# Patient Record
Sex: Female | Born: 1990 | Race: White | Hispanic: No | Marital: Single | Smoking: Current every day smoker
Health system: Southern US, Community
[De-identification: ages and names within clinical notes are randomized; demographics above are authoritative.]

## PROBLEM LIST (undated history)

## (undated) ENCOUNTER — Inpatient Hospital Stay: Payer: Self-pay

## (undated) DIAGNOSIS — Z8759 Personal history of other complications of pregnancy, childbirth and the puerperium: Secondary | ICD-10-CM

## (undated) DIAGNOSIS — Z8632 Personal history of gestational diabetes: Secondary | ICD-10-CM

## (undated) DIAGNOSIS — F419 Anxiety disorder, unspecified: Secondary | ICD-10-CM

## (undated) DIAGNOSIS — G43909 Migraine, unspecified, not intractable, without status migrainosus: Secondary | ICD-10-CM

## (undated) HISTORY — DX: Personal history of other complications of pregnancy, childbirth and the puerperium: Z87.59

## (undated) HISTORY — PX: TONSILLECTOMY: SUR1361

## (undated) HISTORY — DX: Personal history of gestational diabetes: Z86.32

## (undated) HISTORY — PX: ABDOMINAL HYSTERECTOMY: SHX81

## (undated) HISTORY — PX: CHOLECYSTECTOMY: SHX55

---
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
## 2008-03-17 DIAGNOSIS — O24419 Gestational diabetes mellitus in pregnancy, unspecified control: Secondary | ICD-10-CM

## 2008-03-17 DIAGNOSIS — O1490 Unspecified pre-eclampsia, unspecified trimester: Secondary | ICD-10-CM

## 2008-05-08 ENCOUNTER — Emergency Department: Payer: Self-pay | Admitting: Emergency Medicine

## 2008-08-22 ENCOUNTER — Observation Stay: Payer: Self-pay

## 2008-09-21 ENCOUNTER — Observation Stay: Payer: Self-pay

## 2008-10-14 ENCOUNTER — Emergency Department: Payer: Self-pay | Admitting: Emergency Medicine

## 2008-11-11 ENCOUNTER — Observation Stay: Payer: Self-pay | Admitting: Obstetrics & Gynecology

## 2008-11-12 ENCOUNTER — Emergency Department: Payer: Self-pay | Admitting: Emergency Medicine

## 2008-11-19 ENCOUNTER — Emergency Department: Payer: Self-pay | Admitting: Emergency Medicine

## 2008-12-08 ENCOUNTER — Inpatient Hospital Stay: Payer: Self-pay | Admitting: Unknown Physician Specialty

## 2009-01-09 ENCOUNTER — Emergency Department: Payer: Self-pay | Admitting: Emergency Medicine

## 2009-01-11 ENCOUNTER — Ambulatory Visit: Payer: Self-pay | Admitting: Surgery

## 2009-01-15 ENCOUNTER — Ambulatory Visit: Payer: Self-pay | Admitting: Surgery

## 2009-03-19 ENCOUNTER — Emergency Department (HOSPITAL_COMMUNITY): Admission: EM | Admit: 2009-03-19 | Discharge: 2009-03-19 | Payer: Self-pay | Admitting: Emergency Medicine

## 2009-04-10 ENCOUNTER — Emergency Department (HOSPITAL_COMMUNITY): Admission: EM | Admit: 2009-04-10 | Discharge: 2009-04-10 | Payer: Self-pay | Admitting: Emergency Medicine

## 2010-08-25 IMAGING — US ABDOMEN ULTRASOUND
1 series · 17 of 25 positions shown · non-contrast
Comparison: none

REASON FOR EXAM: RUQ PAIN
COMMENTS:

[Series 1: abdomen ultrasound · 17 of 43 slices shown]
[im 1/43]
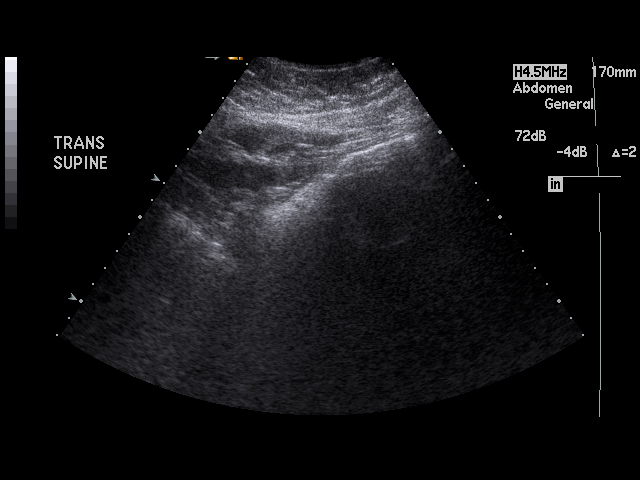
[im 4/43]
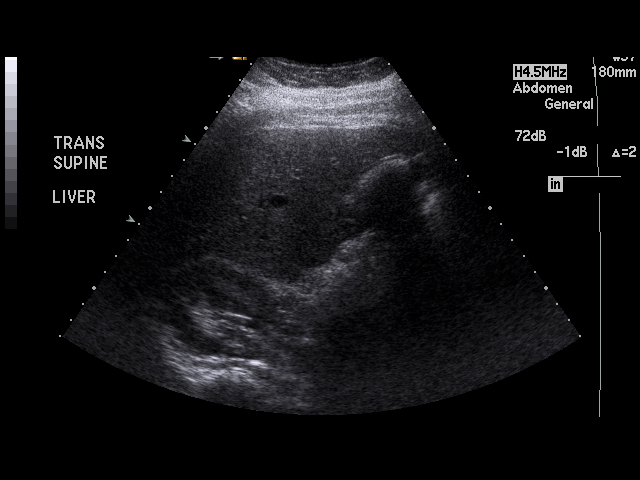
[im 6/43]
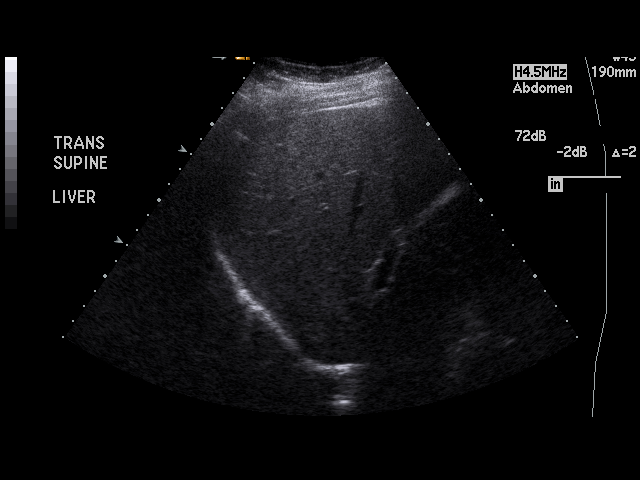
[im 9/43]
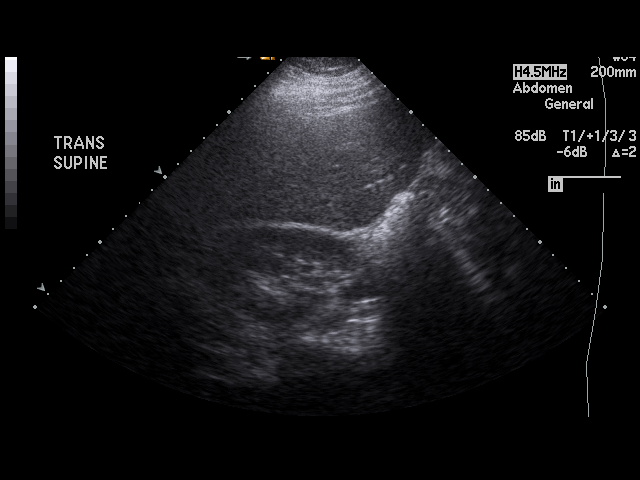
[im 11/43]
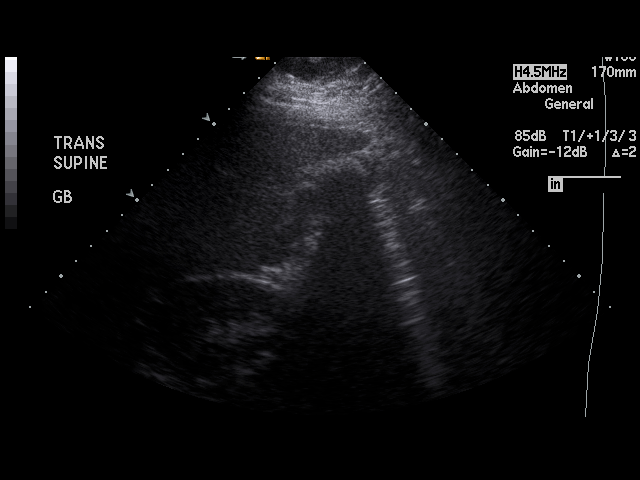
[im 15/43]
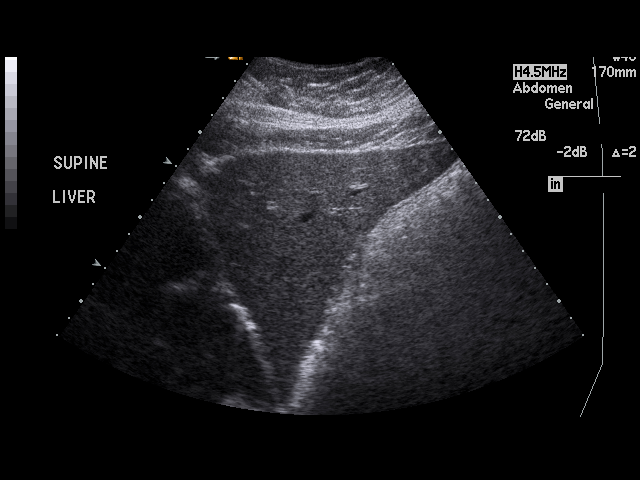
[im 16/43]
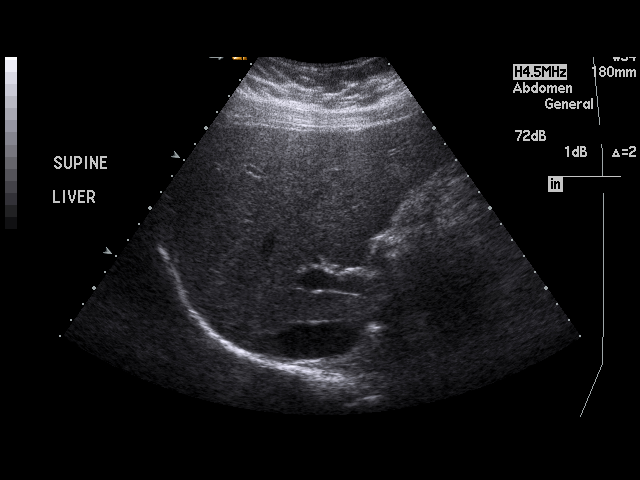
[im 20/43]
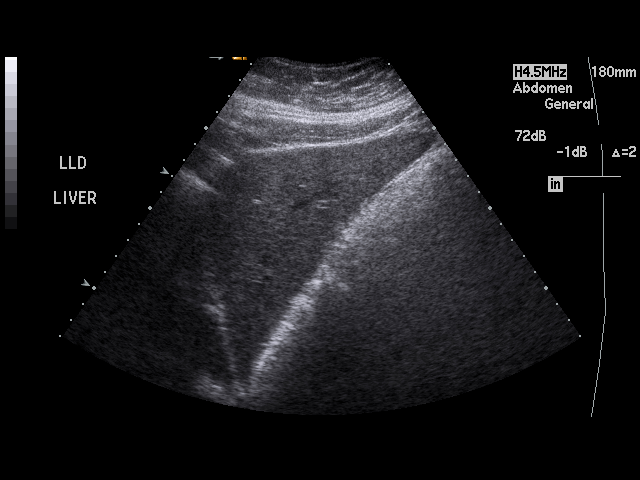
[im 22/43]
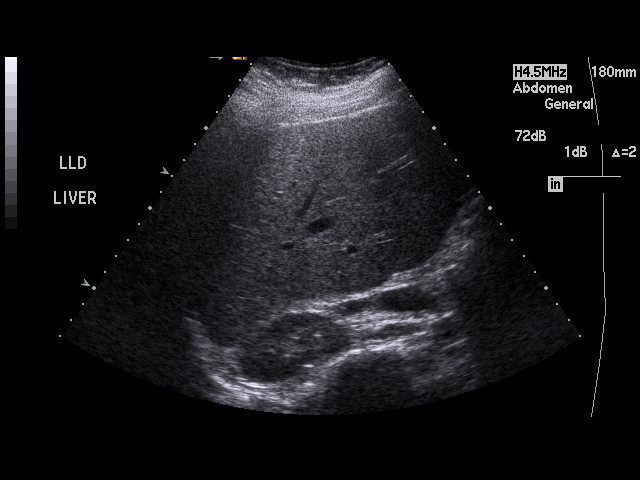
[im 23/43]
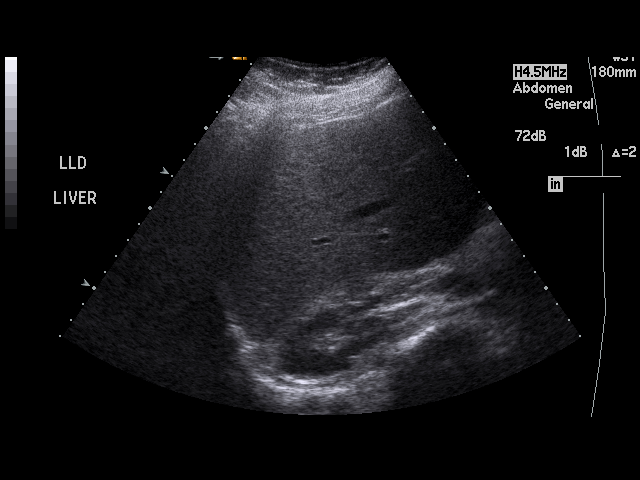
[im 27/43]
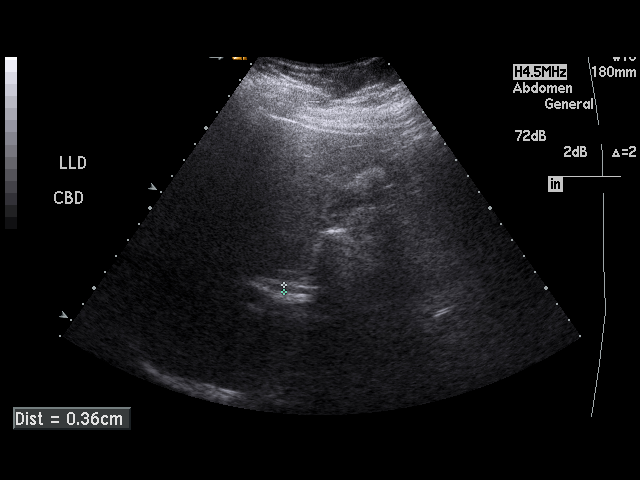
[im 29/43]
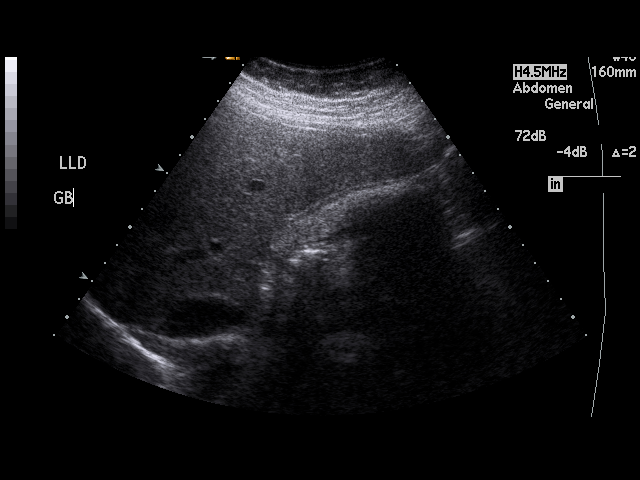
[im 32/43]
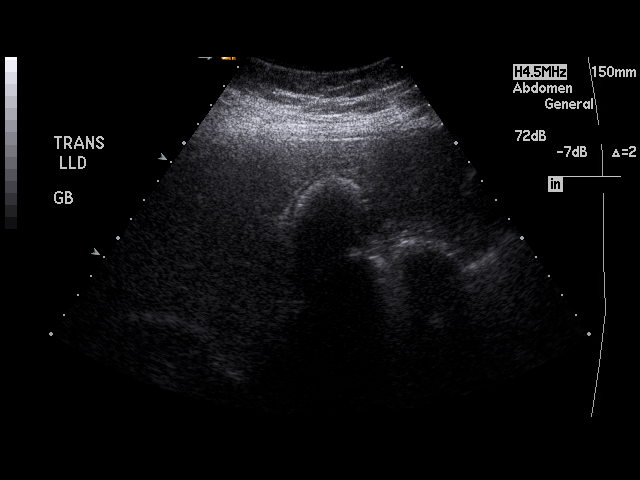
[im 34/43]
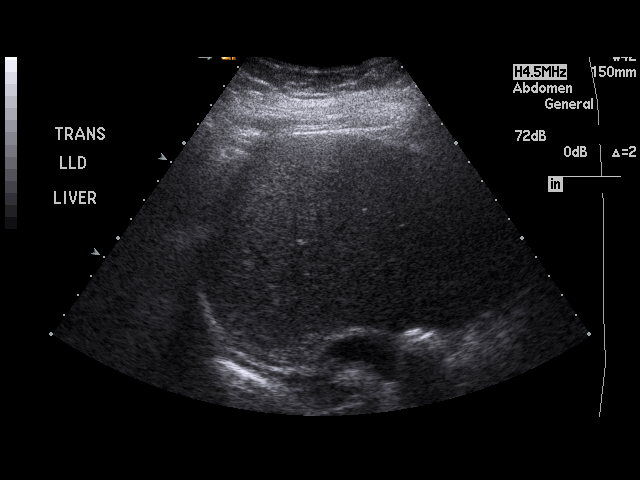
[im 37/43]
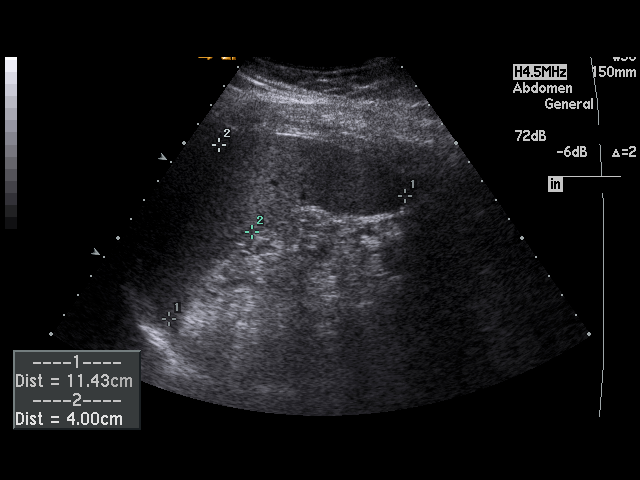
[im 39/43]
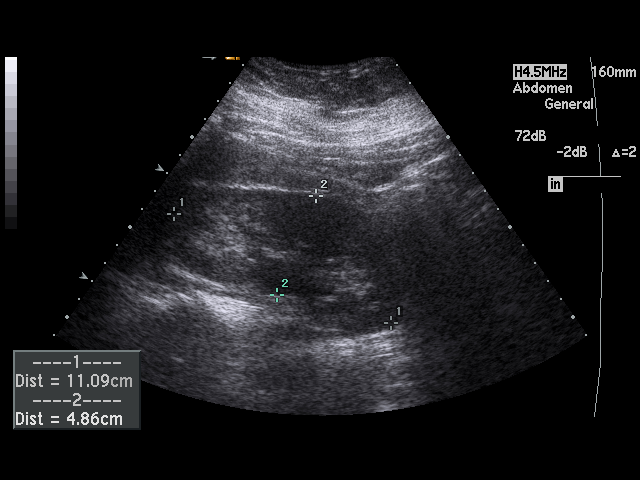
[im 43/43]
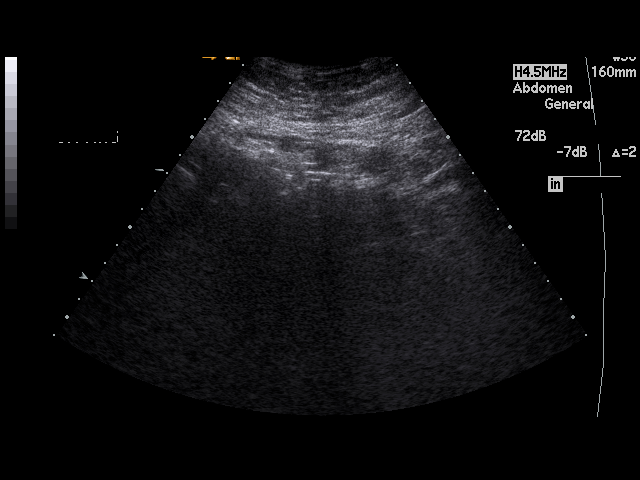

[17 of 25 positions shown; findings below may reference images not displayed]

PROCEDURE:     US  - US ABDOMEN GENERAL SURVEY  - January 10, 2009 [DATE]

RESULT:     Ultrasound of the abdomen demonstrates the gallbladder wall
measures 2.8 mm. The common bile duct is 3.6 mm. The gallbladder is somewhat
contracted and filled with sludge or multiple, tiny stones. There is no
pericholecystic fluid. The sonographic technologist reports that the patient
had a positive sonographic Murphy's sign. The hepatic echotexture is normal.
The pancreas is not seen. The spleen, kidneys and aorta appear to be
unremarkable.
IMPRESSION: Positive sonographic Murphy's sign and a partially
contracted gallbladder filled with sludge and small stones. The wall is near
the upper limits of normal. Surgical consultation is recommended.

## 2012-01-21 ENCOUNTER — Observation Stay: Payer: Self-pay

## 2012-03-03 ENCOUNTER — Observation Stay: Payer: Self-pay | Admitting: Obstetrics and Gynecology

## 2012-03-23 ENCOUNTER — Observation Stay: Payer: Self-pay | Admitting: Obstetrics and Gynecology

## 2012-03-24 ENCOUNTER — Inpatient Hospital Stay: Payer: Self-pay | Admitting: Obstetrics and Gynecology

## 2012-03-24 LAB — CBC WITH DIFFERENTIAL/PLATELET
HCT: 34.7 % — ABNORMAL LOW (ref 35.0–47.0)
HGB: 11.7 g/dL — ABNORMAL LOW (ref 12.0–16.0)
Lymphocyte #: 2.5 10*3/uL (ref 1.0–3.6)
MCH: 28.8 pg (ref 26.0–34.0)
MCHC: 33.8 g/dL (ref 32.0–36.0)
MCV: 85 fL (ref 80–100)
Monocyte #: 0.6 x10 3/mm (ref 0.2–0.9)
Neutrophil #: 7.3 10*3/uL — ABNORMAL HIGH (ref 1.4–6.5)
Neutrophil %: 67.9 %
RBC: 4.06 10*6/uL (ref 3.80–5.20)
WBC: 10.7 10*3/uL (ref 3.6–11.0)

## 2012-07-04 ENCOUNTER — Emergency Department: Payer: Self-pay | Admitting: Emergency Medicine

## 2012-07-04 LAB — CBC
MCH: 29 pg (ref 26.0–34.0)
MCHC: 34.5 g/dL (ref 32.0–36.0)
MCV: 84 fL (ref 80–100)
Platelet: 295 10*3/uL (ref 150–440)
RBC: 4.43 10*6/uL (ref 3.80–5.20)
RDW: 14.8 % — ABNORMAL HIGH (ref 11.5–14.5)
WBC: 10.8 10*3/uL (ref 3.6–11.0)

## 2012-07-04 LAB — HCG, QUANTITATIVE, PREGNANCY: Beta Hcg, Quant.: <1 m[IU]/mL — ABNORMAL LOW

## 2012-12-14 ENCOUNTER — Emergency Department: Payer: Self-pay | Admitting: Internal Medicine

## 2013-09-09 ENCOUNTER — Emergency Department: Payer: Self-pay | Admitting: Internal Medicine

## 2013-11-01 ENCOUNTER — Emergency Department: Payer: Self-pay | Admitting: Emergency Medicine

## 2014-03-31 ENCOUNTER — Emergency Department: Payer: Self-pay | Admitting: Student

## 2014-03-31 LAB — URINALYSIS, COMPLETE
BILIRUBIN, UR: NEGATIVE
Blood: NEGATIVE
Glucose,UR: NEGATIVE mg/dL (ref 0–75)
KETONE: NEGATIVE
Nitrite: NEGATIVE
PROTEIN: NEGATIVE
Ph: 5 (ref 4.5–8.0)
RBC,UR: 2 /HPF (ref 0–5)
Specific Gravity: 1.01 (ref 1.003–1.030)
Squamous Epithelial: 83
WBC UR: 6 /HPF (ref 0–5)

## 2014-03-31 LAB — COMPREHENSIVE METABOLIC PANEL
ANION GAP: 5 — AB (ref 7–16)
Albumin: 3.8 g/dL (ref 3.4–5.0)
Alkaline Phosphatase: 72 U/L
BILIRUBIN TOTAL: 0.4 mg/dL (ref 0.2–1.0)
BUN: 10 mg/dL (ref 7–18)
CHLORIDE: 105 mmol/L (ref 98–107)
CO2: 29 mmol/L (ref 21–32)
CREATININE: 0.96 mg/dL (ref 0.60–1.30)
Calcium, Total: 8.8 mg/dL (ref 8.5–10.1)
EGFR (Non-African Amer.): >60
Glucose: 88 mg/dL (ref 65–99)
Osmolality: 276 (ref 275–301)
POTASSIUM: 4 mmol/L (ref 3.5–5.1)
SGOT(AST): 18 U/L (ref 15–37)
SGPT (ALT): 29 U/L
Sodium: 139 mmol/L (ref 136–145)
TOTAL PROTEIN: 7.5 g/dL (ref 6.4–8.2)

## 2014-03-31 LAB — CBC
HCT: 42 % (ref 35.0–47.0)
HGB: 13.7 g/dL (ref 12.0–16.0)
MCH: 28 pg (ref 26.0–34.0)
MCHC: 32.6 g/dL (ref 32.0–36.0)
MCV: 86 fL (ref 80–100)
Platelet: 295 10*3/uL (ref 150–440)
RBC: 4.88 10*6/uL (ref 3.80–5.20)
RDW: 13.9 % (ref 11.5–14.5)
WBC: 9.2 10*3/uL (ref 3.6–11.0)

## 2014-03-31 LAB — HCG, QUANTITATIVE, PREGNANCY

## 2014-03-31 LAB — CK TOTAL AND CKMB (NOT AT ARMC)
CK, Total: 69 U/L (ref 26–192)
CK-MB: 1.5 ng/mL (ref 0.5–3.6)

## 2014-03-31 LAB — TROPONIN I

## 2014-04-01 LAB — URINE CULTURE

## 2014-06-08 ENCOUNTER — Emergency Department: Payer: Self-pay | Admitting: Emergency Medicine

## 2014-07-25 NOTE — H&P (Signed)
L&D Evaluation:  History Expanded:   HPI 24 yo G2P1001, EDD of 03/29/11 per 10week US, presents at 36 3/7 weeks with c/o nausea, several episodes of vomiting, migraine (pt reports chronic migraines this pregnancy tx with Compazine) and intense pelvic pressure. [facility], Increased DS risk on 1st trimester screen with negative Amniocentesis.    Blood Type (Maternal) A positive    Maternal HIV Negative    Maternal Syphilis Ab Nonreactive    Maternal Varicella Immune    Rubella Results (Maternal) immune    Patient's Medical History depression     Patient's Surgical History Colecystectomy  Arm surgery     Medications Pre Natal Vitamins  Compazine for nausea/headaches     Allergies NKDA    Social History none  former smoker    ROS:   ROS see HPI   Exam:   Vital Signs stable     General appears uncomfortable    Mental Status clear     Abdomen gravid, non-tender    Pelvic no external lesions, 1/50/-2    Mebranes Intact    FHT difficult obtaining continuous FHR tracing, BL 135 min-mod variability, + accels    Ucx absent   Impression:   Impression IUP at 36 weeks with nausea/vomiting   Plan:   Comments IV fluids Phenergan   Electronic Signatures: Brenlyn Beshara, Marta Lamasamara K (CNM)  (Signed 18-Dec-13 09:46)  Authored: L&D Evaluation   Last Updated: 18-Dec-13 09:46 by Vella KohlerBrothers, Bo Rogue K (CNM)

## 2014-07-25 NOTE — H&P (Signed)
L&D Evaluation:  History:   HPI 24 yo G2P1001, EDD of 03/29/11 per 10week US, presents at 39 3/7 weeks for elective IOL. She was seen  by Dr Elta Guadeloupelipp yesterday and arrangements were made for Cervidil ripening last night. Paient has had ongoing pelvic pain and worsening pain recently and resulting insomnia.Patient was not induced last night due to staffing. She reports back today for induction, very upset that induction was not begun. She denies regular contractions today. No bleeding or LOF. 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 She has had no outbreaks since then and has not been prescribed an antiviral. A POS, RI, VI, GBS negative.    Presents with Pelvic pain/ for IOL    Patient's Medical History depression, migraines    Patient's Surgical History Arm surgery, cholecystectomy    Medications Pre Natal Vitamins  Compazine for nausea/headaches. Has been out of vitamins x 2 weeks    Allergies NKDA    Social History none  former smoker   ROS:   ROS see HPI   Exam:   Vital Signs stable    Urine Protein not completed    General upset, tearful    Mental Status clear    Chest clear    Heart normal sinus rhythm, no murmur/gallop/rubs    Abdomen gravid, non-tender    Estimated Fetal Weight Average for gestational age    Fetal Position OP on ultrasound    Edema trace    Reflexes 1+    Pelvic no external lesions, Dr Bonney AidStaebler did speculum exam last night-no HSV lesions seen. CX:1cm/25-50%/-2    Mebranes Intact    FHT normal rate with no decels, 135 with accels to 150    Ucx infrequent    Skin dry   Impression:   Impression IUP at 39 3/7 weeks for elective IOL   Plan:   Plan EFM/NST, Discussed with patient unripe cx and increased risk for C-section, failed induction of labor, and prolonged induction process. Encouraged to not have  induction at this time. Patient adamant about proceeding with induction and  this provider feels that I now need to proceed with the plan of provider who set up induction of labor. Will give Cervidil today.   Electronic Signatures: Trinna BalloonGutierrez, Jamesyn Moorefield L (CNM)  (Signed 08-Jan-14 10:01)  Authored: L&D Evaluation   Last Updated: 08-Jan-14 10:01 by Trinna BalloonGutierrez, Shaunte Tuft L (CNM)

## 2014-08-05 ENCOUNTER — Emergency Department
Admission: EM | Admit: 2014-08-05 | Discharge: 2014-08-05 | Disposition: A | Discharge status: Home / Self Care | Payer: Self-pay | Attending: Emergency Medicine | Admitting: Emergency Medicine

## 2014-08-05 ENCOUNTER — Encounter: Payer: Self-pay | Admitting: Emergency Medicine

## 2014-08-05 DIAGNOSIS — Z3202 Encounter for pregnancy test, result negative: Secondary | ICD-10-CM | POA: Insufficient documentation

## 2014-08-05 DIAGNOSIS — Z720 Tobacco use: Secondary | ICD-10-CM | POA: Insufficient documentation

## 2014-08-05 DIAGNOSIS — Z7251 High risk heterosexual behavior: Secondary | ICD-10-CM

## 2014-08-05 DIAGNOSIS — N898 Other specified noninflammatory disorders of vagina: Secondary | ICD-10-CM

## 2014-08-05 DIAGNOSIS — N941 Dyspareunia: Secondary | ICD-10-CM | POA: Insufficient documentation

## 2014-08-05 DIAGNOSIS — N938 Other specified abnormal uterine and vaginal bleeding: Secondary | ICD-10-CM | POA: Insufficient documentation

## 2014-08-05 DIAGNOSIS — Z7721 Contact with and (suspected) exposure to potentially hazardous body fluids: Secondary | ICD-10-CM

## 2014-08-05 LAB — CHLAMYDIA/NGC RT PCR (ARMC ONLY)
CHLAMYDIA TR: NOT DETECTED
N gonorrhoeae: NOT DETECTED

## 2014-08-05 LAB — WET PREP, GENITAL
Clue Cells Wet Prep HPF POC: NONE SEEN
Trich, Wet Prep: NONE SEEN
Yeast Wet Prep HPF POC: NONE SEEN

## 2014-08-05 NOTE — ED Notes (Signed)
Urine preg negative

## 2014-08-05 NOTE — ED Provider Notes (Signed)
CSN: [id number]     Arrival date & time 08/05/14  1436 History   First MD Initiated Contact with Patient 08/05/14 1522     Chief Complaint  Patient presents with  . Foreign Body in Vagina    condom came off during intercourse last night and pt thinks is retained in vagina, unable to remove herself      HPI Comments: 24 year old female with concerns that she has a retained condom in her vagina. She reports that she had intercourse with a new partner around 5 AM this morning. After their sexual encounter they could not find the condom. Her last normal menstrual period was 2 weeks ago. She is not taking any birth control. She has had some pelvic pain and slight bleeding today  Patient is a 24 y.o. female presenting with foreign body in vagina. The history is provided by the patient.  Foreign Body in Vagina This is a new problem. The current episode started today. The problem has been unchanged. Associated symptoms include abdominal pain. Pertinent negatives include no fever or rash. She has tried nothing for the symptoms. The treatment provided mild relief.    History reviewed. No pertinent past medical history. History reviewed. No pertinent past surgical history. History reviewed. No pertinent family history. History  Substance Use Topics  . Smoking status: Current Every Day Smoker -- 0.25 packs/day  . Smokeless tobacco: Not on file  . Alcohol Use: No   OB History    No data available     Review of Systems  Constitutional: Negative for fever.  Gastrointestinal: Positive for abdominal pain.  Genitourinary: Positive for vaginal bleeding and vaginal discharge. Negative for dysuria, difficulty urinating, pelvic pain and dyspareunia.  Skin: Negative for rash.  All other systems reviewed and are negative.     Allergies  Review of patient's allergies indicates no known allergies.  Home Medications   Prior to Admission medications   Not on File   BP 145/73 mmHg  Pulse 89   Temp(Src) 98.2 F (36.8 C) (Oral)  Resp 20  Ht 5\' 8"  (1.727 m)  Wt 190 lb (86.183 kg)  BMI 28.90 kg/m2  SpO2 98%  LMP 07/09/2014 (Approximate) Physical Exam  Constitutional: She is oriented to person, place, and time. Vital signs are normal. She appears well-developed and well-nourished.  Non-toxic appearance. She does not have a sickly appearance.  Abdominal: There is no tenderness. There is no rebound and no guarding.  Genitourinary: Uterus normal. Pelvic exam was performed with patient supine. Cervix exhibits discharge. Cervix exhibits no motion tenderness. Right adnexum displays no mass and no tenderness. Left adnexum displays no mass and no tenderness. Vaginal discharge found.  No evidence of foreign body   Musculoskeletal: Normal range of motion.  Neurological: She is alert and oriented to person, place, and time.  Skin: Skin is warm and dry. No rash noted.  Psychiatric: She has a normal mood and affect. Her behavior is normal. Judgment and thought content normal.  Nursing note and vitals reviewed.   ED Course  Procedures (including critical care time) Labs Review Labs Reviewed  WET PREP, GENITAL - Abnormal; Notable for the following:    WBC, Wet Prep HPF POC FEW (*)    All other components within normal limits  CHLAMYDIA/NGC RT PCR [facility])     Imaging Review No results found.   EKG Interpretation None      MDM  Discussed with patient that there was no evidence of retained condom. She  requests STD and pregnancy testing. Advised for positive gonorrhea and Chlamydia tests she will be called. Wet prep is negative. Pregnancy test is negative Final diagnoses:  Exposure to potentially hazardous body fluids  Vaginal discharge  Unprotected sexual intercourse       Luvenia Redden, PA-C 08/05/14 1623  Governor Rooks, MD 08/07/14 [phone]

## 2014-08-05 NOTE — ED Notes (Signed)
States slight bleeding when tried to remove

## 2015-02-28 ENCOUNTER — Emergency Department
Admission: EM | Admit: 2015-02-28 | Discharge: 2015-02-28 | Disposition: A | Discharge status: Home / Self Care | Payer: Self-pay | Attending: Emergency Medicine | Admitting: Emergency Medicine

## 2015-02-28 ENCOUNTER — Emergency Department: Payer: Self-pay

## 2015-02-28 DIAGNOSIS — J40 Bronchitis, not specified as acute or chronic: Secondary | ICD-10-CM | POA: Insufficient documentation

## 2015-02-28 DIAGNOSIS — F17200 Nicotine dependence, unspecified, uncomplicated: Secondary | ICD-10-CM | POA: Insufficient documentation

## 2015-02-28 DIAGNOSIS — Z79899 Other long term (current) drug therapy: Secondary | ICD-10-CM | POA: Insufficient documentation

## 2015-02-28 MED ORDER — IPRATROPIUM-ALBUTEROL 0.5-2.5 (3) MG/3ML IN SOLN
3.0000 mL | Freq: Once | RESPIRATORY_TRACT | Status: AC
  Administered 2015-02-28: 3 mL via RESPIRATORY_TRACT
  Filled 2015-02-28: qty 3

## 2015-02-28 MED ORDER — PREDNISONE 20 MG PO TABS
60.0000 mg | ORAL_TABLET | Freq: Once | ORAL | Status: AC
  Administered 2015-02-28: 60 mg via ORAL
  Filled 2015-02-28: qty 3

## 2015-02-28 MED ORDER — IBUPROFEN 800 MG PO TABS
800.0000 mg | ORAL_TABLET | Freq: Once | ORAL | Status: AC
  Administered 2015-02-28: 800 mg via ORAL
  Filled 2015-02-28: qty 1

## 2015-02-28 MED ORDER — IPRATROPIUM-ALBUTEROL 0.5-2.5 (3) MG/3ML IN SOLN
RESPIRATORY_TRACT | Status: AC
  Filled 2015-02-28: qty 3

## 2015-02-28 MED ORDER — PREDNISONE 20 MG PO TABS: 60.0000 mg | ORAL_TABLET | Freq: Every day | ORAL | Status: DC

## 2015-02-28 MED ORDER — HYDROCOD POLST-CPM POLST ER 10-8 MG/5ML PO SUER: 5.0000 mL | Freq: Two times a day (BID) | ORAL | Status: DC

## 2015-02-28 MED ORDER — BENZONATATE 100 MG PO CAPS
100.0000 mg | ORAL_CAPSULE | Freq: Once | ORAL | Status: AC
  Administered 2015-02-28: 100 mg via ORAL
  Filled 2015-02-28: qty 1

## 2015-02-28 NOTE — ED Provider Notes (Signed)
[facility] Emergency Department Provider Note  ____________________________________________  Time seen: Approximately 3:14 AM  I have reviewed the triage vital signs and the nursing notes.   HISTORY  Chief Complaint Cough    HPI Marie Palmer is a 24 y.o. female who comes into the hospital with a cough since yesterday. The patient reports that she's been coughing so much it is starting to hurt into her chest and her back. The patient reports that she took some TheraFlu but it hasn't helped. The patient reports that her daughter has a cold and was taken to her doctor and told that it was a virus. The patient also reports some shortness of breath. She's had hot and cold sweats on and off but no distinct fever. She reports that she coughs so much that she has spit up some green stuff. The patient does not have a history of asthma but is a former smoker. The patient quit smoking about a month ago. The patient came in because she was unsure what was going on and wanted to get evaluated.Patient rates her pain a 7 out of 10 in intensity.   No past medical history  There are no active problems to display for this patient.   Past surgical history  CCY  Current Outpatient Rx  Name  Route  Sig  Dispense  Refill  . Phenylephrine-Pheniramine-DM (THERAFLU COLD & COUGH PO)   Oral   Take by mouth.         . chlorpheniramine-HYDROcodone (TUSSIONEX PENNKINETIC ER) 10-8 MG/5ML SUER   Oral   Take 5 mLs by mouth 2 (two) times daily.   140 mL   0   . predniSONE (DELTASONE) 20 MG tablet   Oral   Take 3 tablets (60 mg total) by mouth daily.   12 tablet   0     Allergies Review of patient's allergies indicates no known allergies.  No family history on file.  Social History Social History  Substance Use Topics  . Smoking status: Current Every Day Smoker -- 0.25 packs/day  . Smokeless tobacco: Not on file  . Alcohol Use: No    Review of  Systems Constitutional: No fever/chills Eyes: No visual changes. ENT: No sore throat. Cardiovascular:  chest pain. Respiratory: Cough and shortness of breath Gastrointestinal: No abdominal pain.  No nausea, no vomiting.  No diarrhea.  No constipation. Genitourinary: Negative for dysuria. Musculoskeletal: back pain. Skin: Negative for rash. Neurological: Negative for headaches, focal weakness or numbness.  10-point ROS otherwise negative.  ____________________________________________   PHYSICAL EXAM:  VITAL SIGNS: ED Triage Vitals  Enc Vitals Group     BP 02/28/15 0259 135/77 mmHg     Pulse Rate 02/28/15 0259 98     Resp 02/28/15 0259 18     Temp 02/28/15 0259 98.5 F (36.9 C)     Temp src --      SpO2 02/28/15 0259 93 %     Weight 02/28/15 0259 185 lb (83.915 kg)     Height 02/28/15 0259  (1.727 m)     Head Cir --      Peak Flow --      Pain Score 02/28/15 0259 7     Pain Loc --      Pain Edu? --      Excl. in GC? --     Constitutional: Alert and oriented. ill appearing and in mild distress. Eyes: Conjunctivae are normal. PERRL. EOMI. Head: Atraumatic. Nose: No congestion/rhinnorhea.  Mouth/Throat: Mucous membranes are moist.  Oropharynx non-erythematous. Cardiovascular: Normal rate, regular rhythm. Grossly normal heart sounds.  Good peripheral circulation. Respiratory: Normal respiratory effort.  No retractions. Lungs CTAB. Some mild prolonged expiratory phase with significant coughing, chest tender to palpation Gastrointestinal: Soft and nontender. No distention. Active bowel sounds Musculoskeletal: No lower extremity tenderness nor edema.   Neurologic:  Normal speech and language.  Skin:  Skin is warm, dry and intact.  Psychiatric: Mood and affect are normal.   ____________________________________________   LABS (all labs ordered are listed, but only abnormal results are displayed)  Labs Reviewed - No data to  display ____________________________________________  EKG  None ____________________________________________  RADIOLOGY  Chest x-ray: No active cardiopulmonary disease ____________________________________________   PROCEDURES  Procedure(s) performed: None  Critical Care performed: No  ____________________________________________   INITIAL IMPRESSION / ASSESSMENT AND PLAN / ED COURSE  Pertinent labs & imaging results that were available during my care of the patient were reviewed by me and considered in my medical decision making (see chart for details).  This is a 24 year old female who comes in today with a cough and shortness of breath as well as some chest pain. The patient does not have a pneumonia on chest x-ray but this had some mild prolonged expiratory phase. I did give the patient a DuoNeb, benzonatate and ibuprofen. I will reassess the patient's symptoms and determine if her coughing has improved.  The patient reports that she does still have some cough but it is improved compared to when I was in the room previously. I informed the patient that it appears as though she has bronchitis that may be viral in nature similar to her daughters. I will give the patient some prednisone as well as some Tussionex for her cough. I will encourage the patient to follow-up with the acute care clinic. The patient will be discharged home to follow-up. ____________________________________________   FINAL CLINICAL IMPRESSION(S) / ED DIAGNOSES  Final diagnoses:  Bronchitis      Rebecka ApleyAllison P Mitesh Rosendahl, MD 02/28/15 0830

## 2015-02-28 NOTE — Discharge Instructions (Signed)
Upper Respiratory Infection, Adult Most upper respiratory infections (URIs) are a viral infection of the air passages leading to the lungs. A URI affects the nose, throat, and upper air passages. The most common type of URI is nasopharyngitis and is typically referred to as "the common cold." URIs run their course and usually go away on their own. Most of the time, a URI does not require medical attention, but sometimes a bacterial infection in the upper airways can follow a viral infection. This is called a secondary infection. Sinus and middle ear infections are common types of secondary upper respiratory infections. Bacterial pneumonia can also complicate a URI. A URI can worsen asthma and chronic obstructive pulmonary disease (COPD). Sometimes, these complications can require emergency medical care and may be life threatening.  CAUSES Almost all URIs are caused by viruses. A virus is a type of germ and can spread from one person to another.  RISKS FACTORS You may be at risk for a URI if:   You smoke.   You have chronic heart or lung disease.  You have a weakened defense (immune) system.   You are very young or very old.   You have nasal allergies or asthma.  You work in crowded or poorly ventilated areas.  You work in health care facilities or schools. SIGNS AND SYMPTOMS  Symptoms typically develop 2-3 days after you come in contact with a cold virus. Most viral URIs last 7-10 days. However, viral URIs from the influenza virus (flu virus) can last 14-18 days and are typically more severe. Symptoms may include:   Runny or stuffy (congested) nose.   Sneezing.   Cough.   Sore throat.   Headache.   Fatigue.   Fever.   Loss of appetite.   Pain in your forehead, behind your eyes, and over your cheekbones (sinus pain).  Muscle aches.  DIAGNOSIS  Your health care provider may diagnose a URI by:  Physical exam.  Tests to check that your symptoms are not due to  another condition such as:  Strep throat.  Sinusitis.  Pneumonia.  Asthma. TREATMENT  A URI goes away on its own with time. It cannot be cured with medicines, but medicines may be prescribed or recommended to relieve symptoms. Medicines may help:  Reduce your fever.  Reduce your cough.  Relieve nasal congestion. HOME CARE INSTRUCTIONS   Take medicines only as directed by your health care provider.   Gargle warm saltwater or take cough drops to comfort your throat as directed by your health care provider.  Use a warm mist humidifier or inhale steam from a shower to increase air moisture. This may make it easier to breathe.  Drink enough fluid to keep your urine clear or pale yellow.   Eat soups and other clear broths and maintain good nutrition.   Rest as needed.   Return to work when your temperature has returned to normal or as your health care provider advises. You may need to stay home longer to avoid infecting others. You can also use a face mask and careful hand washing to prevent spread of the virus.  Increase the usage of your inhaler if you have asthma.   Do not use any tobacco products, including cigarettes, chewing tobacco, or electronic cigarettes. If you need help quitting, ask your health care provider. PREVENTION  The best way to protect yourself from getting a cold is to practice good hygiene.   Avoid oral or hand contact with people with cold   symptoms.   Wash your hands often if contact occurs.  There is no clear evidence that vitamin C, vitamin E, echinacea, or exercise reduces the chance of developing a cold. However, it is always recommended to get plenty of rest, exercise, and practice good nutrition.  SEEK MEDICAL CARE IF:   You are getting worse rather than better.   Your symptoms are not controlled by medicine.   You have chills.  You have worsening shortness of breath.  You have brown or red mucus.  You have yellow or brown nasal  discharge.  You have pain in your face, especially when you bend forward.  You have a fever.  You have swollen neck glands.  You have pain while swallowing.  You have white areas in the back of your throat. SEEK IMMEDIATE MEDICAL CARE IF:   You have severe or persistent:  Headache.  Ear pain.  Sinus pain.  Chest pain.  You have chronic lung disease and any of the following:  Wheezing.  Prolonged cough.  Coughing up blood.  A change in your usual mucus.  You have a stiff neck.  You have changes in your:  Vision.  Hearing.  Thinking.  Mood. MAKE SURE YOU:   Understand these instructions.  Will watch your condition.  Will get help right away if you are not doing well or get worse.   This information is not intended to replace advice given to you by your health care provider. Make sure you discuss any questions you have with your health care provider.   Document Released: 08/27/2000 Document Revised: 07/18/2014 Document Reviewed: 06/08/2013 Elsevier Interactive Patient Education 2016 [employer]  

## 2015-02-28 NOTE — ED Notes (Signed)
Pt with cough since yest, denies any fever.

## 2015-05-15 ENCOUNTER — Emergency Department
Admission: EM | Admit: 2015-05-15 | Discharge: 2015-05-16 | Disposition: A | Discharge status: Home / Self Care | Payer: Self-pay | Attending: Emergency Medicine | Admitting: Emergency Medicine

## 2015-05-15 ENCOUNTER — Encounter: Payer: Self-pay | Admitting: Emergency Medicine

## 2015-05-15 DIAGNOSIS — H53149 Visual discomfort, unspecified: Secondary | ICD-10-CM | POA: Insufficient documentation

## 2015-05-15 DIAGNOSIS — R51 Headache: Secondary | ICD-10-CM

## 2015-05-15 DIAGNOSIS — B349 Viral infection, unspecified: Secondary | ICD-10-CM | POA: Insufficient documentation

## 2015-05-15 DIAGNOSIS — R519 Headache, unspecified: Secondary | ICD-10-CM

## 2015-05-15 DIAGNOSIS — Z87891 Personal history of nicotine dependence: Secondary | ICD-10-CM | POA: Insufficient documentation

## 2015-05-15 HISTORY — DX: Migraine, unspecified, not intractable, without status migrainosus: G43.909

## 2015-05-15 LAB — RAPID INFLUENZA A&B ANTIGENS (ARMC ONLY)
INFLUENZA A (ARMC): NOT DETECTED — AB
INFLUENZA B (ARMC): NOT DETECTED — AB

## 2015-05-15 LAB — POCT RAPID STREP A: Streptococcus, Group A Screen (Direct): NEGATIVE

## 2015-05-15 MED ORDER — METOCLOPRAMIDE HCL 5 MG/ML IJ SOLN
10.0000 mg | Freq: Once | INTRAMUSCULAR | Status: AC
  Administered 2015-05-15: 10 mg via INTRAVENOUS
  Filled 2015-05-15: qty 2

## 2015-05-15 MED ORDER — ONDANSETRON 4 MG PO TBDP
ORAL_TABLET | ORAL | Status: AC
  Administered 2015-05-15: 4 mg via ORAL
  Filled 2015-05-15: qty 1

## 2015-05-15 MED ORDER — DIPHENHYDRAMINE HCL 50 MG/ML IJ SOLN
25.0000 mg | Freq: Once | INTRAMUSCULAR | Status: AC
  Administered 2015-05-15: 25 mg via INTRAVENOUS
  Filled 2015-05-15: qty 1

## 2015-05-15 MED ORDER — SODIUM CHLORIDE 0.9 % IV SOLN
Freq: Once | INTRAVENOUS | Status: AC
  Administered 2015-05-15: 22:00:00 via INTRAVENOUS

## 2015-05-15 MED ORDER — KETOROLAC TROMETHAMINE 30 MG/ML IJ SOLN
30.0000 mg | Freq: Once | INTRAMUSCULAR | Status: AC
  Administered 2015-05-15: 30 mg via INTRAVENOUS
  Filled 2015-05-15: qty 1

## 2015-05-15 MED ORDER — ONDANSETRON 4 MG PO TBDP
4.0000 mg | ORAL_TABLET | Freq: Once | ORAL | Status: AC | PRN
  Administered 2015-05-15: 4 mg via ORAL

## 2015-05-15 MED ORDER — ONDANSETRON 4 MG PO TBDP: 4.0000 mg | ORAL_TABLET | Freq: Three times a day (TID) | ORAL | Status: DC | PRN

## 2015-05-15 MED ORDER — OXYCODONE-ACETAMINOPHEN 5-325 MG PO TABS
1.0000 | ORAL_TABLET | Freq: Once | ORAL | Status: AC
Start: 2015-05-15 — End: 2015-05-15
  Administered 2015-05-15: 1 via ORAL
  Filled 2015-05-15: qty 1

## 2015-05-15 MED ORDER — BUTALBITAL-APAP-CAFFEINE 50-325-40 MG PO TABS: 1.0000 | ORAL_TABLET | Freq: Four times a day (QID) | ORAL | Status: DC | PRN

## 2015-05-15 NOTE — ED Provider Notes (Signed)
[facility] Emergency Department Provider Note     Time seen: ----------------------------------------- 9:24 PM on 05/15/2015 -----------------------------------------    I have reviewed the triage vital signs and the nursing notes.   HISTORY  Chief Complaint Migraine; Cough; and Sore Throat    HPI Marie Palmer is a 25 y.o. female who presents to ER for migraines for the last 2 days patient states she has diffuse headache, reports nausea and light sensitivity. She has had some nausea and vomiting with a migraine. Also reports sore throat cough and hot and cold feeling. Patient is not had body aches or chills. She taken over-the-counter medications without any improvement. She does have a history of migraines.   Past Medical History  Diagnosis Date  . Migraines     There are no active problems to display for this patient.   Past Surgical History  Procedure Laterality Date  . Cholecystectomy      Allergies Review of patient's allergies indicates no known allergies.  Social History Social History  Substance Use Topics  . Smoking status: Former Smoker -- 0.25 packs/day  . Smokeless tobacco: None  . Alcohol Use: No    Review of Systems Constitutional: Positive for fever Eyes: Negative for visual changes. ENT: Positive for sore throat Cardiovascular: Negative for chest pain. Respiratory: Negative for shortness of breath. Positive for cough Gastrointestinal: Negative for abdominal pain, positive for vomiting Genitourinary: Negative for dysuria. Musculoskeletal: Negative for back pain. Skin: Negative for rash. Neurological: Positive for diffuse headache  10-point ROS otherwise negative.  ____________________________________________   PHYSICAL EXAM:  VITAL SIGNS: ED Triage Vitals  Enc Vitals Group     BP 05/15/15 1920 146/71 mmHg     Pulse Rate 05/15/15 1920 88     Resp 05/15/15 1920 20     Temp 05/15/15 1920 98.8 F (37.1 C)      Temp Source 05/15/15 1920 Oral     SpO2 05/15/15 1920 95 %     Weight 05/15/15 1920 190 lb (86.183 kg)     Height 05/15/15 1920  (1.727 m)     Head Cir --      Peak Flow --      Pain Score 05/15/15 1920 9     Pain Loc --      Pain Edu? --      Excl. in GC? --     Constitutional: Alert and oriented. Well appearing and in no distress. Eyes: Conjunctivae are normal. PERRL. Normal extraocular movements. Minimal photophobia ENT   Head: Normocephalic and atraumatic.   Nose: No congestion/rhinnorhea.   Mouth/Throat: Mucous membranes are moist.   Neck: No stridor. Cardiovascular: Normal rate, regular rhythm. Normal and symmetric distal pulses are present in all extremities. No murmurs, rubs, or gallops. Respiratory: Normal respiratory effort without tachypnea nor retractions. Breath sounds are clear and equal bilaterally. No wheezes/rales/rhonchi. Gastrointestinal: Soft and nontender. No distention. No abdominal bruits.  Musculoskeletal: Nontender with normal range of motion in all extremities. No joint effusions.  No lower extremity tenderness nor edema. Neurologic:  Normal speech and language. No gross focal neurologic deficits are appreciated. Speech is normal. No gait instability. Skin:  Skin is warm, dry and intact. No rash noted. Psychiatric: Mood and affect are normal. Speech and behavior are normal. Patient exhibits appropriate insight and judgment. ___________________________________________  ED COURSE:  Pertinent labs & imaging results that were available during my care of the patient were reviewed by me and considered in my medical decision making (  see chart for details). Patient is in no acute distress, we'll assess for influenza. Also give her IV fluids and headache cocktail. ____________________________________________    LABS (pertinent positives/negatives)  Labs Reviewed  RAPID INFLUENZA A&B ANTIGENS ([facility] ONLY)  POCT RAPID STREP A    ____________________________________________  FINAL ASSESSMENT AND PLAN  Headache, viral illness  Plan: Patient with labs as dictated above. Patient is feeling better after IV fluid and headache cocktail. Her labs as dictated above. She stable for outpatient follow-up with her doctor.   Emily Filbert, MD   Emily Filbert, MD 05/15/15 2126

## 2015-05-15 NOTE — ED Notes (Signed)
POCT rapid strep negative

## 2015-05-15 NOTE — Discharge Instructions (Signed)
General Headache Without Cause A headache is pain or discomfort felt around the head or neck area. The specific cause of a headache may not be found. There are many causes and types of headaches. A few common ones are:  Tension headaches.  Migraine headaches.  Cluster headaches.  Chronic daily headaches. HOME CARE INSTRUCTIONS  Watch your condition for any changes. Take these steps to help with your condition: Managing Pain  Take over-the-counter and prescription medicines only as told by your health care provider.  Lie down in a dark, quiet room when you have a headache.  If directed, apply ice to the head and neck area:  Put ice in a plastic bag.  Place a towel between your skin and the bag.  Leave the ice on for 20 minutes, 2-3 times per day.  Use a heating pad or hot shower to apply heat to the head and neck area as told by your health care provider.  Keep lights dim if bright lights bother you or make your headaches worse. Eating and Drinking  Eat meals on a regular schedule.  Limit alcohol use.  Decrease the amount of caffeine you drink, or stop drinking caffeine. General Instructions  Keep all follow-up visits as told by your health care provider. This is important.  Keep a headache journal to help find out what may trigger your headaches. For example, write down:  What you eat and drink.  How much sleep you get.  Any change to your diet or medicines.  Try massage or other relaxation techniques.  Limit stress.  Sit up straight, and do not tense your muscles.  Do not use tobacco products, including cigarettes, chewing tobacco, or e-cigarettes. If you need help quitting, ask your health care provider.  Exercise regularly as told by your health care provider.  Sleep on a regular schedule. Get 7-9 hours of sleep, or the amount recommended by your health care provider. SEEK MEDICAL CARE IF:   Your symptoms are not helped by medicine.  You have a  headache that is different from the usual headache.  You have nausea or you vomit.  You have a fever. SEEK IMMEDIATE MEDICAL CARE IF:   Your headache becomes severe.  You have repeated vomiting.  You have a stiff neck.  You have a loss of vision.  You have problems with speech.  You have pain in the eye or ear.  You have muscular weakness or loss of muscle control.  You lose your balance or have trouble walking.  You feel faint or pass out.  You have confusion.   This information is not intended to replace advice given to you by your health care provider. Make sure you discuss any questions you have with your health care provider.   Document Released: 03/03/2005 Document Revised: 11/22/2014 Document Reviewed: 06/26/2014 Elsevier Interactive Patient Education 2016 [employer]  Viral Infections A viral infection can be caused by different types of viruses.Most viral infections are not serious and resolve on their own. However, some infections may cause severe symptoms and may lead to further complications. SYMPTOMS Viruses can frequently cause:  Minor sore throat.  Aches and pains.  Headaches.  Runny nose.  Different types of rashes.  Watery eyes.  Tiredness.  Cough.  Loss of appetite.  Gastrointestinal infections, resulting in nausea, vomiting, and diarrhea. These symptoms do not respond to antibiotics because the infection is not caused by bacteria. However, you might catch a bacterial infection following the viral infection. This is  sometimes called a "superinfection." Symptoms of such a bacterial infection may include:  Worsening sore throat with pus and difficulty swallowing.  Swollen neck glands.  Chills and a high or persistent fever.  Severe headache.  Tenderness over the sinuses.  Persistent overall ill feeling (malaise), muscle aches, and tiredness (fatigue).  Persistent cough.  Yellow, green, or brown mucus production with  coughing. HOME CARE INSTRUCTIONS   Only take over-the-counter or prescription medicines for pain, discomfort, diarrhea, or fever as directed by your caregiver.  Drink enough water and fluids to keep your urine clear or pale yellow. Sports drinks can provide valuable electrolytes, sugars, and hydration.  Get plenty of rest and maintain proper nutrition. Soups and broths with crackers or rice are fine. SEEK IMMEDIATE MEDICAL CARE IF:   You have severe headaches, shortness of breath, chest pain, neck pain, or an unusual rash.  You have uncontrolled vomiting, diarrhea, or you are unable to keep down fluids.  You or your child has an oral temperature above 102 F (38.9 C), not controlled by medicine.  Your baby is older than 3 months with a rectal temperature of 102 F (38.9 C) or higher.  Your baby is 66 months old or younger with a rectal temperature of 100.4 F (38 C) or higher. MAKE SURE YOU:   Understand these instructions.  Will watch your condition.  Will get help right away if you are not doing well or get worse.   This information is not intended to replace advice given to you by your health care provider. Make sure you discuss any questions you have with your health care provider.   Document Released: 12/11/2004 Document Revised: 05/26/2011 Document Reviewed: 08/09/2014 Elsevier Interactive Patient Education [employer]

## 2015-05-15 NOTE — ED Notes (Addendum)
Patient reports migraine times two days. Patient reports nausea and light sensitivity with migraine. Patient reports that she has had cough, sore throat and low grade fever. Patient states that she has taken otc medication with no improvement.

## 2015-11-20 ENCOUNTER — Encounter: Payer: Self-pay | Admitting: Emergency Medicine

## 2015-11-20 DIAGNOSIS — N9489 Other specified conditions associated with female genital organs and menstrual cycle: Secondary | ICD-10-CM | POA: Diagnosis not present

## 2015-11-20 DIAGNOSIS — Z7952 Long term (current) use of systemic steroids: Secondary | ICD-10-CM | POA: Diagnosis not present

## 2015-11-20 DIAGNOSIS — O99331 Smoking (tobacco) complicating pregnancy, first trimester: Secondary | ICD-10-CM | POA: Diagnosis not present

## 2015-11-20 DIAGNOSIS — Z3A01 Less than 8 weeks gestation of pregnancy: Secondary | ICD-10-CM | POA: Diagnosis not present

## 2015-11-20 DIAGNOSIS — Z79899 Other long term (current) drug therapy: Secondary | ICD-10-CM | POA: Insufficient documentation

## 2015-11-20 DIAGNOSIS — R1032 Left lower quadrant pain: Secondary | ICD-10-CM | POA: Diagnosis not present

## 2015-11-20 DIAGNOSIS — Z791 Long term (current) use of non-steroidal anti-inflammatories (NSAID): Secondary | ICD-10-CM | POA: Diagnosis not present

## 2015-11-20 DIAGNOSIS — R55 Syncope and collapse: Secondary | ICD-10-CM | POA: Insufficient documentation

## 2015-11-20 DIAGNOSIS — F17200 Nicotine dependence, unspecified, uncomplicated: Secondary | ICD-10-CM | POA: Diagnosis not present

## 2015-11-20 DIAGNOSIS — J069 Acute upper respiratory infection, unspecified: Secondary | ICD-10-CM | POA: Diagnosis not present

## 2015-11-20 DIAGNOSIS — O99511 Diseases of the respiratory system complicating pregnancy, first trimester: Secondary | ICD-10-CM | POA: Insufficient documentation

## 2015-11-20 LAB — CBC
HEMATOCRIT: 37.8 % (ref 35.0–47.0)
Hemoglobin: 13.1 g/dL (ref 12.0–16.0)
MCH: 28.7 pg (ref 26.0–34.0)
MCHC: 34.6 g/dL (ref 32.0–36.0)
MCV: 83 fL (ref 80.0–100.0)
Platelets: 270 10*3/uL (ref 150–440)
RBC: 4.56 MIL/uL (ref 3.80–5.20)
RDW: 13.8 % (ref 11.5–14.5)
WBC: 11.5 10*3/uL — ABNORMAL HIGH (ref 3.6–11.0)

## 2015-11-20 LAB — URINALYSIS COMPLETE WITH MICROSCOPIC (ARMC ONLY)
Bilirubin Urine: NEGATIVE
Glucose, UA: NEGATIVE mg/dL
Hgb urine dipstick: NEGATIVE
KETONES UR: NEGATIVE mg/dL
NITRITE: NEGATIVE
PH: 5 (ref 5.0–8.0)
PROTEIN: NEGATIVE mg/dL
Specific Gravity, Urine: 1.018 (ref 1.005–1.030)

## 2015-11-20 LAB — BASIC METABOLIC PANEL
ANION GAP: 7 (ref 5–15)
BUN: 7 mg/dL (ref 6–20)
CHLORIDE: 109 mmol/L (ref 101–111)
CO2: 22 mmol/L (ref 22–32)
Calcium: 8.9 mg/dL (ref 8.9–10.3)
Creatinine, Ser: 0.68 mg/dL (ref 0.44–1.00)
Glucose, Bld: 110 mg/dL — ABNORMAL HIGH (ref 65–99)
POTASSIUM: 3.5 mmol/L (ref 3.5–5.1)
Sodium: 138 mmol/L (ref 135–145)

## 2015-11-20 LAB — POCT PREGNANCY, URINE: PREG TEST UR: POSITIVE — AB

## 2015-11-20 NOTE — ED Triage Notes (Signed)
Pt presents to ED with c/o syncopal episode just prior to arrival; denies urinary incontinence. Pt states she had a sudden onset of dizziness and fell back onto her bed from a standing postion. Loc for approx 20-30 seconds. Denies any injury. Pt reports having congestion and nausea for the past several days. Pt alert and calm with no increased work of breathing or acute distress noted at this time.

## 2015-11-21 ENCOUNTER — Emergency Department: Payer: Medicaid Other

## 2015-11-21 ENCOUNTER — Encounter: Payer: Self-pay | Admitting: Radiology

## 2015-11-21 ENCOUNTER — Emergency Department
Admission: EM | Admit: 2015-11-21 | Discharge: 2015-11-21 | Discharge status: Against Medical Advice | Payer: Medicaid Other | Attending: Emergency Medicine | Admitting: Emergency Medicine

## 2015-11-21 DIAGNOSIS — Z3490 Encounter for supervision of normal pregnancy, unspecified, unspecified trimester: Secondary | ICD-10-CM

## 2015-11-21 DIAGNOSIS — R55 Syncope and collapse: Secondary | ICD-10-CM

## 2015-11-21 DIAGNOSIS — J069 Acute upper respiratory infection, unspecified: Secondary | ICD-10-CM

## 2015-11-21 LAB — HCG, QUANTITATIVE, PREGNANCY: hCG, Beta Chain, Quant, S: 62903 m[IU]/mL — ABNORMAL HIGH (ref <5)

## 2015-11-21 MED ORDER — METOCLOPRAMIDE HCL 5 MG/ML IJ SOLN
10.0000 mg | Freq: Once | INTRAMUSCULAR | Status: AC
  Administered 2015-11-21: 10 mg via INTRAVENOUS
  Filled 2015-11-21: qty 2

## 2015-11-21 MED ORDER — ACETAMINOPHEN 500 MG PO TABS
1000.0000 mg | ORAL_TABLET | Freq: Once | ORAL | Status: AC
  Administered 2015-11-21: 1000 mg via ORAL
  Filled 2015-11-21: qty 2

## 2015-11-21 MED ORDER — SODIUM CHLORIDE 0.9 % IV BOLUS (SEPSIS)
1000.0000 mL | Freq: Once | INTRAVENOUS | Status: AC
  Administered 2015-11-21: 1000 mL via INTRAVENOUS

## 2015-11-21 NOTE — ED Provider Notes (Signed)
[facility] Emergency Department Provider Note  ____________________________________________  Time seen: Approximately 1:47 AM  I have reviewed the triage vital signs and the nursing notes.   HISTORY  Chief Complaint Loss of Consciousness; Nasal Congestion; Dizziness; and Nausea   HPI Marie Palmer is a 25 y.o. female the history of migraines and presents for evaluation of a syncopal episode. Patient reports for the last 3 days she feels like she is coming down with a cold. She has had congestion, cough, body aches, and headaches. She describes her headache as moderate, diffuse, throbbing, similar to all her prior migraines. She reports that today she felt dizzy multiple times and while she was in her room this evening she had a syncopal episode. She reports that she fell on her bed and did not injure herself. She reports that she felt dizzy before passing out. She denies any prior history of syncope. Patient's LMP was on 09/29/15 but she endorses irregular periods. She is also complaining of lower abdominal pain that has been intermittent for the last 3 days, worse in the left lower quadrant, crampy in nature. She reports a history of ovarian cysts and she attributed the pain to her cyst. The pain is currently mild. Patient denies personal or family history of sudden death. She does endorse she hasn't been eating and drinking much in the last few days due to nausea.She also reports chills/  Past Medical History:  Diagnosis Date  . Migraines     There are no active problems to display for this patient.   Past Surgical History:  Procedure Laterality Date  . CHOLECYSTECTOMY      Prior to Admission medications   Medication Sig Start Date End Date Taking? Authorizing Provider  butalbital-acetaminophen-caffeine (FIORICET) [id number] MG tablet Take 1-2 tablets by mouth every 6 (six) hours as needed for headache. 05/15/15 05/14/16  Emily FilbertJonathan E Williams, MD    chlorpheniramine-HYDROcodone [facility](TUSSIONEX PENNKINETIC ER) 10-8 MG/5ML SUER Take 5 mLs by mouth 2 (two) times daily. 02/28/15   Rebecka ApleyAllison P Webster, MD  ondansetron (ZOFRAN ODT) 4 MG disintegrating tablet Take 1 tablet (4 mg total) by mouth every 8 (eight) hours as needed for nausea or vomiting. 05/15/15   Emily FilbertJonathan E Williams, MD  Phenylephrine-Pheniramine-DM [facility](THERAFLU COLD & COUGH PO) Take by mouth.    Historical Provider, MD  predniSONE (DELTASONE) 20 MG tablet Take 3 tablets (60 mg total) by mouth daily. 02/28/15   Rebecka ApleyAllison P Webster, MD    Allergies Review of patient's allergies indicates no known allergies.  No family history on file.  Social History Social History  Substance Use Topics  . Smoking status: Current Some Day Smoker    Packs/day: 0.25  . Smokeless tobacco: Never Used  . Alcohol use No    Review of Systems  Constitutional: Negative for fever. + chills and fatigue Eyes: Negative for visual changes. ENT: Negative for sore throat. Cardiovascular: Negative for chest pain. Respiratory: Negative for shortness of breath. + cough and congestion Gastrointestinal: + LLQ abdominal pain, nausea. No vomiting or diarrhea. Genitourinary: Negative for dysuria. Musculoskeletal: Negative for back pain. Skin: Negative for rash. Neurological: Negative for headaches, weakness or numbness.  ____________________________________________   PHYSICAL EXAM:  VITAL SIGNS: ED Triage Vitals [11/20/15 2232]  Enc Vitals Group     BP 121/68     Pulse Rate 80     Resp 18     Temp 98.6 F (37 C)     Temp Source Oral  SpO2 98 %     Weight 199 lb (90.3 kg)     Height 5\' 8"  (1.727 m)     Head Circumference      Peak Flow      Pain Score 5     Pain Loc      Pain Edu?      Excl. in GC?     Constitutional: Alert and oriented. Well appearing and in no apparent distress. HEENT:      Head: Normocephalic and atraumatic.         Eyes: Conjunctivae are normal. Sclera is non-icteric. EOMI.  PERRL      Mouth/Throat: Mucous membranes are moist.       Neck: Supple with no signs of meningismus. Cardiovascular: Regular rate and rhythm. No murmurs, gallops, or rubs. 2+ symmetrical distal pulses are present in all extremities. No JVD. Respiratory: Normal respiratory effort. Lungs are clear to auscultation bilaterally. No wheezes, crackles, or rhonchi.  Gastrointestinal: Soft, tender to palpation on the left lower quadrant, and non distended with positive bowel sounds. No rebound or guarding. Genitourinary: No CVA tenderness. Musculoskeletal: Nontender with normal range of motion in all extremities. No edema, cyanosis, or erythema of extremities. Neurologic: Normal speech and language. Face is symmetric. Moving all extremities. No gross focal neurologic deficits are appreciated. Skin: Skin is warm, dry and intact. No rash noted. Psychiatric: Mood and affect are normal. Speech and behavior are normal.  ____________________________________________   LABS (all labs ordered are listed, but only abnormal results are displayed)  Labs Reviewed  BASIC METABOLIC PANEL - Abnormal; Notable for the following:       Result Value   Glucose, Bld 110 (*)    All other components within normal limits  CBC - Abnormal; Notable for the following:    WBC 11.5 (*)    All other components within normal limits  URINALYSIS COMPLETEWITH MICROSCOPIC ([facility] ONLY) - Abnormal; Notable for the following:    Color, Urine YELLOW (*)    APPearance HAZY (*)    Leukocytes, UA 1+ (*)    Bacteria, UA RARE (*)    Squamous Epithelial / LPF 6-30 (*)    All other components within normal limits  HCG, QUANTITATIVE, PREGNANCY - Abnormal; Notable for the following:    hCG, Beta Chain, Quant, S 62,903 (*)    All other components within normal limits  POCT PREGNANCY, URINE - Abnormal; Notable for the following:    Preg Test, Ur POSITIVE (*)    All other components within normal limits  POC URINE PREG, ED    ____________________________________________  EKG  ED ECG REPORT I, Nita Sickle, the attending physician, personally viewed and interpreted this ECG.  Normal sinus rhythm, rate 69, normal intervals, normal axis, no STE or depressions, no evidence of HOCM, AV block, delta wave, ARVD, prolonged QTc, WPW.  ____________________________________________  RADIOLOGY  WUJ:[id number]   Transvaginal US: 1. Single live intrauterine pregnancy noted, with a crown-rump length of 1.2 cm, corresponding to a gestational age of [redacted] weeks 3 days. This matches the gestational age of [redacted] weeks 4 days by LMP, reflecting an estimated date of delivery of July 05, 2016. 2. Small amount of subchorionic hemorrhage noted. ___________________________________________   PROCEDURES  Procedure(s) performed: None Procedures Critical Care performed:  None ____________________________________________   INITIAL IMPRESSION / ASSESSMENT AND PLAN / ED COURSE  25 y.o. female the history of migraines and presents for evaluation of a syncopal episode in the setting of 3 days of URI  symptoms, decreased oral intake, nausea. Patient also complaining of left lower quadrant abdominal pain and she is tender to palpation as well. Vital signs are within normal limits. EKG with no acute findings. Patient was found to be pregnant here which she was not aware. Bedside ultrasound showing IUP. We'll send patient for formal ultrasound to rule out ectopic specially since she is tender in the left lower quadrant. We'll give her IV fluids, IV Reglan, and by mouth Tylenol for her headache and body aches. We'll also get a chest x-ray to rule out pneumonia. Remaining of her labs are within normal limits  Clinical Course  Comment By Time  Patient reports that she is here visiting a friend and the friend is here with her and has an emergency and needs to go. Patient does not want to wait for the results of her ultrasound. I have explained  to the patient that we have not rule out an ectopic pregnancy which can cause severe internal bleeding, death, and loss of her baby. Patient understands these risks and continues to demand to be discharged at this time. I explained to her that she is going to be discharged AGAINST MEDICAL ADVICE. I also encouraged her to return at any point that we are happy to see her and continue management. I will continue to follow the results of the ultrasound and I'll call the patient if those are positive for an ectopic pregnancy. Nita Sickle, MD 09/06 [phone]   11/21/2015 at 2:44 AM:  The patient requested to leave.  I considered this to be leaving against medical advice. I personally discussed the following with them:   That they currently had a medical condition of new diagnosis of pregnancy and syncope and I am concerned that they may have an ectopic pregnancy, internal bleeding.   1)  My proposed course of evaluation and treatment includes, but is not limited to,  Follow up results of ultrasound, cardiac monitoring.  Benefits of staying include possible diagnosis or excluding of ectopic pregnancy or an alternative serious condition such as conditions that could have cause her to syncopize, which if identified early would lead to appropriate intervention in a timely manner lessening the burden of disability and death.  2) Risks of leaving before this had been completed include: misdiagnosis, worsening illness leading up to and including prolonged or permanent disability or death.  Specific risks pertinent, but not all inclusive, of their current medical condition include but are not limited to intra-abdominal hemorrhage, miscarriage, death.  Despite this they stated they wanted to leave due to a personal emergency and refused further evaluation, treatment, or admission at this time.   They appeared clinically sober, were mentating appropriately, were free from distracting injury, had adequately controlled  acute pain, appeared to have intact insight, judgment, and reason, and in my opinion had the capacity to make this decision.  Specifically, they were able to verbally state back in a coherent manner their current medical condition/current diagnosis, the proposed course of evaluation and/or treatment, and the risks, benefits, and alternatives of treatment versus leaving against medical advice.   They understand that they may return to seek medical attention here at ANY time they want.  I strongly advised them to return to the Emergency Department immediately if they experience any new or worsening symptoms that concern them, or simply if they reconsider continued evaluation and/or treatment as previously discussed.  This would be without any repercussions, though they understand they likely will need to wait again  in the Emergency Department if other patients are in front of them, rather than being brought straight back.  They understood this is another advantage of staying, but still insisted upon leaving.  I recommended they follow-up with her PCP at the earliest available opportunity/appointment for further evaluation and treatment.   The patient was discharged against medical advice.  They did accept written discharge instructions.   Pertinent labs & imaging results that were available during my care of the patient were reviewed by me and considered in my medical decision making (see chart for details).  _________________________ 7:49 AM on 11/21/2015 -----------------------------------------  Followed up patient [place] with IUP and no evidence of ectopic.  ____________________________________________   FINAL CLINICAL IMPRESSION(S) / ED DIAGNOSES  Final diagnoses:  Syncope, unspecified syncope type  Pregnancy  URI (upper respiratory infection)      NEW MEDICATIONS STARTED DURING THIS VISIT:  Discharge Medication List as of 11/21/2015  2:47 AM       Note:  This document was  prepared using Dragon voice recognition software and may include unintentional dictation errors.    Nita Sickle, MD 11/21/15 [phone]

## 2015-11-21 NOTE — ED Notes (Signed)
Patient transported to X-ray 

## 2015-11-21 NOTE — ED Notes (Signed)
Pt stating she has an emergency and is unable to stay for the results, pt informed she will have to sign AMA, pt verbalized understanding. Informed pt if symptoms became worse to return to the ED. Pt verbalized understanding.

## 2015-11-21 NOTE — ED Notes (Signed)
Patient transported to Ultrasound 

## 2015-12-26 ENCOUNTER — Ambulatory Visit (INDEPENDENT_AMBULATORY_CARE_PROVIDER_SITE_OTHER): Payer: Medicaid Other | Admitting: Obstetrics and Gynecology

## 2015-12-26 ENCOUNTER — Encounter: Payer: Self-pay | Admitting: Obstetrics and Gynecology

## 2015-12-26 VITALS — BP 113/71 | HR 80 | Wt 243.1 lb

## 2015-12-26 DIAGNOSIS — O09299 Supervision of pregnancy with other poor reproductive or obstetric history, unspecified trimester: Secondary | ICD-10-CM

## 2015-12-26 DIAGNOSIS — Z8632 Personal history of gestational diabetes: Secondary | ICD-10-CM

## 2015-12-26 DIAGNOSIS — Z124 Encounter for screening for malignant neoplasm of cervix: Secondary | ICD-10-CM

## 2015-12-26 DIAGNOSIS — F17201 Nicotine dependence, unspecified, in remission: Secondary | ICD-10-CM

## 2015-12-26 DIAGNOSIS — Z1379 Encounter for other screening for genetic and chromosomal anomalies: Secondary | ICD-10-CM

## 2015-12-26 DIAGNOSIS — O99210 Obesity complicating pregnancy, unspecified trimester: Secondary | ICD-10-CM

## 2015-12-26 DIAGNOSIS — O0991 Supervision of high risk pregnancy, unspecified, first trimester: Secondary | ICD-10-CM | POA: Diagnosis not present

## 2015-12-26 DIAGNOSIS — Z369 Encounter for antenatal screening, unspecified: Secondary | ICD-10-CM

## 2015-12-26 DIAGNOSIS — Z113 Encounter for screening for infections with a predominantly sexual mode of transmission: Secondary | ICD-10-CM

## 2015-12-26 LAB — POCT URINALYSIS DIPSTICK
BILIRUBIN UA: NEGATIVE
GLUCOSE UA: NEGATIVE
KETONES UA: NEGATIVE
LEUKOCYTES UA: NEGATIVE
Nitrite, UA: NEGATIVE
Protein, UA: NEGATIVE
Spec Grav, UA: 1.025
Urobilinogen, UA: NEGATIVE
pH, UA: 6

## 2015-12-26 NOTE — Progress Notes (Signed)
OBSTETRIC INITIAL PRENATAL VISIT  Subjective:    Marie Palmer is being seen today for her first obstetrical visit.  This is not a planned pregnancy. She is a 25 y.o. [id number] female at [redacted]w[redacted]d gestation, Estimated Date of Delivery: 07/05/16 by last menstrual period of 09/29/2015. Her obstetrical history is significant for obesity, pre-eclampsia and gestational diabetes in 1st pregnancy. Relationship with FOB: significant other, living together. Patient does intend to breast feed. Pregnancy history fully reviewed.    Obstetric History   G3   P2   T1   P1   A0   L2    SAB0   TAB0   Ectopic0   Multiple0   Live Births2     # Outcome Date GA Lbr Len/2nd Weight Sex Delivery Anes PTL Lv  3 Current           2 Term 2014 [redacted]w[redacted]d  6 lb 13 oz (3.09 kg) F Vag-Spont EPI  LIV  1 Preterm 2010 [redacted]w[redacted]d  6 lb 12 oz (3.062 kg) M Vag-Spont EPI  LIV     Complications: Gestational diabetes,Pre-eclampsia      Gynecologic History:  Last pap smear was with last pregnancy (~ 2013 or 2014).  Results were normal.  Denies h/o abnormal pap smears in the past.  Denies history of STIs.    Past Medical History:  Diagnosis Date  . History of gestational diabetes   . History of pre-eclampsia   . Migraines     No family history on file.   Past Surgical History:  Procedure Laterality Date  . CHOLECYSTECTOMY      Social History   Social History  . Marital status: Single    Spouse name: N/A  . Number of children: N/A  . Years of education: N/A   Occupational History  . Not on file.   Social History Main Topics  . Smoking status: Former Smoker    Packs/day: 0.25    Quit date: 10/23/2015  . Smokeless tobacco: Never Used     Comment: cessation after discovery of pregnancy  . Alcohol use No  . Drug use: No  . Sexual activity: Not on file   Other Topics Concern  . Not on file   Social History Narrative  . No narrative on file    Outpatient Encounter Prescriptions as of 12/26/2015  Medication Sig   . Prenat-FeFmCb-DSS-FA-DHA w/o A (CITRANATAL HARMONY) 27-1-260 MG CAPS Take 27 mg by mouth daily.  . [DISCONTINUED] butalbital-acetaminophen-caffeine (FIORICET) 50-325-40 MG tablet Take 1-2 tablets by mouth every 6 (six) hours as needed for headache.  . [DISCONTINUED] chlorpheniramine-HYDROcodone (TUSSIONEX PENNKINETIC ER) 10-8 MG/5ML SUER Take 5 mLs by mouth 2 (two) times daily.  . [DISCONTINUED] ondansetron (ZOFRAN ODT) 4 MG disintegrating tablet Take 1 tablet (4 mg total) by mouth every 8 (eight) hours as needed for nausea or vomiting.  . [DISCONTINUED] Phenylephrine-Pheniramine-DM (THERAFLU COLD & COUGH PO) Take by mouth.  . [DISCONTINUED] predniSONE (DELTASONE) 20 MG tablet Take 3 tablets (60 mg total) by mouth daily.   No facility-administered encounter medications on file as of 12/26/2015.      No Known Allergies   Review of Systems General:Not Present- Fever, Weight Loss and Weight Gain. Skin:Not Present- Rash. HEENT:Not Present- Blurred Vision, Headache and Bleeding Gums. Respiratory:Not Present- Difficulty Breathing. Breast:Not Present- Breast Mass. Cardiovascular:Not Present- Chest Pain, Elevated Blood Pressure, Fainting / Blacking Out and Shortness of Breath. Gastrointestinal:Not Present- Abdominal Pain, Constipation, Nausea and Vomiting. Female Genitourinary:Not Present- Frequency, Painful Urination, Pelvic  Pain, Vaginal Bleeding, Vaginal Discharge, Contractions, regular, Fetal Movements Decreased, Urinary Complaints and Vaginal Fluid. Musculoskeletal:Not Present- Back Pain and Leg Cramps. Neurological:Not Present- Dizziness. Psychiatric:Not Present- Depression.     Objective:   Blood pressure 113/71, pulse 80, weight 243 lb 1.6 oz (110.3 kg), last menstrual period 09/29/2015.  Body mass index is 36.96 kg/m.  General Appearance:    Alert, cooperative, no distress, appears stated age, moderately obese  Head:    Normocephalic, without obvious abnormality,  atraumatic  Eyes:    PERRL, conjunctiva/corneas clear, EOM's intact, both eyes  Ears:    Normal external ear canals, both ears  Nose:   Nares normal, septum midline, mucosa normal, no drainage or sinus tenderness  Throat:   Lips, mucosa, and tongue normal; teeth and gums normal  Neck:   Supple, symmetrical, trachea midline, no adenopathy; thyroid: no enlargement/tenderness/nodules; no carotid bruit or JVD  Back:     Symmetric, no curvature, ROM normal, no CVA tenderness  Lungs:     Clear to auscultation bilaterally, respirations unlabored  Chest Wall:    No tenderness or deformity   Heart:    Regular rate and rhythm, S1 and S2 normal, no murmur, rub or gallop  Breast Exam:    No tenderness, masses, or nipple abnormality  Abdomen:     Soft, non-tender, bowel sounds active all four quadrants, no masses, no organomegaly.  FH 12.  FHT 153  bpm.  Genitalia:    Pelvic:external genitalia normal, vagina without lesions, discharge, or tenderness, rectovaginal septum  normal. Cervix normal in appearance, no cervical motion tenderness, no adnexal masses or tenderness.  Pregnancy positive findings: uterine enlargement: 12 wk size, nontender.   Rectal:    Normal external sphincter.  No hemorrhoids appreciated. Internal exam not done.   Extremities:   Extremities normal, atraumatic, no cyanosis or edema  Pulses:   2+ and symmetric all extremities  Skin:   Skin color, texture, turgor normal, no rashes or lesions  Lymph nodes:   Cervical, supraclavicular, and axillary nodes normal  Neurologic:   CNII-XII intact, normal strength, sensation and reflexes throughout      Assessment:   Pregnancy at 12 and 4/7 weeks   Obesity, Class II H/o gestational diabetes H/o pre-eclampsia Tobacco use, currently in remission Cervical cancer screening   Plan:    Initial labs ordered. Pap smear performed today.  Prenatal vitamins encouraged. Problem list reviewed and updated. To perform early glucola for h/o  obesity and GDM in prior pregnancy.  Needs to begin daily baby aspirin 81 mg after 13 weeks for h/o GDM, h/o pre-eclampsia, and obesity.  New OB counseling:  The patient has been given an overview regarding routine prenatal care.  Recommendations regarding diet, weight gain, and exercise in pregnancy were given.  Weight gain of no more than 20 lbs this pregnancy,  Prenatal testing, optional genetic testing, and ultrasound use in pregnancy were reviewed.  AFP3 discussed: ordered. Benefits of Breast Feeding were discussed. The patient is encouraged to consider nursing her baby post partum. Continue to encourage maintenance of smoking cessation.  Follow up in 4 weeks.  50% of 30 min visit spent on counseling and coordination of care.     Hildred LaserAnika Coal Nearhood, MD [facility]

## 2015-12-26 NOTE — Patient Instructions (Signed)
Prenatal Care °WHAT IS PRENATAL CARE?  °Prenatal care is the process of caring for a pregnant woman before she gives birth. Prenatal care makes sure that she and her baby remain as healthy as possible throughout pregnancy. Prenatal care may be provided by a midwife, family practice health care provider, or a childbirth and pregnancy specialist (obstetrician). Prenatal care may include physical examinations, testing, treatments, and education on nutrition, lifestyle, and social support services. °WHY IS PRENATAL CARE SO IMPORTANT?  °Early and consistent prenatal care increases the chance that you and your baby will remain healthy throughout your pregnancy. This type of care also decreases a baby's risk of being born too early (prematurely), or being born smaller than expected (small for gestational age). Any underlying medical conditions you may have that could pose a risk during your pregnancy are discussed during prenatal care visits. You will also be monitored regularly for any new conditions that may arise during your pregnancy so they can be treated quickly and effectively. °WHAT HAPPENS DURING PRENATAL CARE VISITS? °Prenatal care visits may include the following: °Discussion °Tell your health care provider about any new signs or symptoms you have experienced since your last visit. These might include: °· Nausea or vomiting. °· Increased or decreased level of energy. °· Difficulty sleeping. °· Back or leg pain. °· Weight changes. °· Frequent urination. °· Shortness of breath with physical activity. °· Changes in your skin, such as the development of a rash or itchiness. °· Vaginal discharge or bleeding. °· Feelings of excitement or nervousness. °· Changes in your baby's movements. °You may want to write down any questions or topics you want to discuss with your health care provider and bring them with you to your appointment. °Examination °During your first prenatal care visit, you will likely have a complete  physical exam. Your health care provider will often examine your vagina, cervix, and the position of your uterus, as well as check your heart, lungs, and other body systems. As your pregnancy progresses, your health care provider will measure the size of your uterus and your baby's position inside your uterus. He or she may also examine you for early signs of labor. Your prenatal visits may also include checking your blood pressure and, after about 10-12 weeks of pregnancy, listening to your baby's heartbeat. °Testing °Regular testing often includes: °· Urinalysis. This checks your urine for glucose, protein, or signs of infection. °· Blood count. This checks the levels of white and red blood cells in your body. °· Tests for sexually transmitted infections (STIs). Testing for STIs at the beginning of pregnancy is routinely done and is required in many states. °· Antibody testing. You will be checked to see if you are immune to certain illnesses, such as rubella, that can affect a developing fetus. °· Glucose screen. Around 24-28 weeks of pregnancy, your blood glucose level will be checked for signs of gestational diabetes. Follow-up tests may be recommended. °· Group B strep. This is a bacteria that is commonly found inside a woman's vagina. This test will inform your health care provider if you need an antibiotic to reduce the amount of this bacteria in your body prior to labor and childbirth. °· Ultrasound. Many pregnant women undergo an ultrasound screening around 18-20 weeks of pregnancy to evaluate the health of the fetus and check for any developmental abnormalities. °· HIV (human immunodeficiency virus) testing. Early in your pregnancy, you will be screened for HIV. If you are at high risk for HIV, this test   may be repeated during your third trimester of pregnancy. °You may be offered other testing based on your age, personal or family medical history, or other factors.  °HOW OFTEN SHOULD I PLAN TO SEE MY  HEALTH CARE PROVIDER FOR PRENATAL CARE? °Your prenatal care check-up schedule depends on any medical conditions you have before, or develop during, your pregnancy. If you do not have any underlying medical conditions, you will likely be seen for checkups: °· Monthly, during the first 6 months of pregnancy. °· Twice a month during months 7 and 8 of pregnancy. °· Weekly starting in the 9th month of pregnancy and until delivery. °If you develop signs of early labor or other concerning signs or symptoms, you may need to see your health care provider more often. Ask your health care provider what prenatal care schedule is best for you. °WHAT CAN I DO TO KEEP MYSELF AND MY BABY AS HEALTHY AS POSSIBLE DURING MY PREGNANCY? °· Take a prenatal vitamin containing 400 micrograms (0.4 mg) of folic acid every day. Your health care provider may also ask you to take additional vitamins such as iodine, vitamin D, iron, copper, and zinc. °· Take 1500-2000 mg of calcium daily starting at your 20th week of pregnancy until you deliver your baby. °· Make sure you are up to date on your vaccinations. Unless directed otherwise by your health care provider: °¨ You should receive a tetanus, diphtheria, and pertussis (Tdap) vaccination between the 27th and 36th week of your pregnancy, regardless of when your last Tdap immunization occurred. This helps protect your baby from whooping cough (pertussis) after he or she is born. °¨ You should receive an annual inactivated influenza vaccine (IIV) to help protect you and your baby from influenza. This can be done at any point during your pregnancy. °· Eat a well-rounded diet that includes: °¨ Fresh fruits and vegetables. °¨ Lean proteins. °¨ Calcium-rich foods such as milk, yogurt, hard cheeses, and dark, leafy greens. °¨ Whole grain breads. °· Do not eat seafood high in mercury, including: °¨ Swordfish. °¨ Tilefish. °¨ Shark. °¨ King mackerel. °¨ More than 6 oz tuna per week. °· Do not eat: °¨ Raw  or undercooked meats or eggs. °¨ Unpasteurized foods, such as soft cheeses (brie, blue, or feta), juices, and milks. °¨ Lunch meats. °¨ Hot dogs that have not been heated until they are steaming. °· Drink enough water to keep your urine clear or pale yellow. For many women, this may be 10 or more 8 oz glasses of water each day. Keeping yourself hydrated helps deliver nutrients to your baby and may prevent the start of pre-term uterine contractions. °· Do not use any tobacco products including cigarettes, chewing tobacco, or electronic cigarettes. If you need help quitting, ask your health care provider. °· Do not drink beverages containing alcohol. No safe level of alcohol consumption during pregnancy has been determined. °· Do not use any illegal drugs. These can harm your developing baby or cause a miscarriage. °· Ask your health care provider or pharmacist before taking any prescription or over-the-counter medicines, herbs, or supplements. °· Limit your caffeine intake to no more than 200 mg per day. °· Exercise. Unless told otherwise by your health care provider, try to get 30 minutes of moderate exercise most days of the week. Do not  do high-impact activities, contact sports, or activities with a high risk of falling, such as horseback riding or downhill skiing. °· Get plenty of rest. °· Avoid anything that raises your   body temperature, such as hot tubs and saunas. °· If you own a cat, do not empty its litter box. Bacteria contained in cat feces can cause an infection called toxoplasmosis. This can result in serious harm to the fetus. °· Stay away from chemicals such as insecticides, lead, mercury, and cleaning or paint products that contain solvents. °· Do not have any X-rays taken unless medically necessary. °· Take a childbirth and breastfeeding preparation class. Ask your health care provider if you need a referral or recommendation. °  °This information is not intended to replace advice given to you by  your health care provider. Make sure you discuss any questions you have with your health care provider. °  °Document Released: 03/06/2003 Document Revised: 03/24/2014 Document Reviewed: 05/18/2013 °Elsevier Interactive Patient Education ©2016 [employer] ° °

## 2015-12-27 ENCOUNTER — Ambulatory Visit (INDEPENDENT_AMBULATORY_CARE_PROVIDER_SITE_OTHER): Payer: Medicaid Other

## 2015-12-27 ENCOUNTER — Other Ambulatory Visit: Payer: Self-pay | Admitting: Obstetrics and Gynecology

## 2015-12-27 ENCOUNTER — Other Ambulatory Visit: Payer: Medicaid Other

## 2015-12-27 DIAGNOSIS — Z3482 Encounter for supervision of other normal pregnancy, second trimester: Secondary | ICD-10-CM

## 2015-12-27 DIAGNOSIS — Z369 Encounter for antenatal screening, unspecified: Secondary | ICD-10-CM

## 2015-12-27 DIAGNOSIS — Z1379 Encounter for other screening for genetic and chromosomal anomalies: Secondary | ICD-10-CM

## 2015-12-27 LAB — RUBELLA SCREEN: RUBELLA: 1.17 {index} (ref >0.99)

## 2015-12-27 LAB — ANTIBODY SCREEN: Antibody Screen: NEGATIVE

## 2015-12-27 LAB — HEMOGLOBIN A1C
Est. average glucose Bld gHb Est-mCnc: 100 mg/dL
HEMOGLOBIN A1C: 5.1 % (ref 4.8–5.6)

## 2015-12-27 LAB — HEPATITIS B SURFACE ANTIGEN: Hepatitis B Surface Ag: NEGATIVE

## 2015-12-27 LAB — CBC WITH DIFFERENTIAL/PLATELET
BASOS: 1 %
Basophils Absolute: 0.1 10*3/uL (ref 0.0–0.2)
EOS (ABSOLUTE): 0.2 10*3/uL (ref 0.0–0.4)
EOS: 2 %
HEMATOCRIT: 36.5 % (ref 34.0–46.6)
HEMOGLOBIN: 12.5 g/dL (ref 11.1–15.9)
IMMATURE GRANS (ABS): 0 10*3/uL (ref 0.0–0.1)
IMMATURE GRANULOCYTES: 0 %
LYMPHS: 23 %
Lymphocytes Absolute: 2.6 10*3/uL (ref 0.7–3.1)
MCH: 28.8 pg (ref 26.6–33.0)
MCHC: 34.2 g/dL (ref 31.5–35.7)
MCV: 84 fL (ref 79–97)
Monocytes Absolute: 0.5 10*3/uL (ref 0.1–0.9)
Monocytes: 5 %
NEUTROS PCT: 69 %
Neutrophils Absolute: 7.6 10*3/uL — ABNORMAL HIGH (ref 1.4–7.0)
Platelets: 258 10*3/uL (ref 150–379)
RBC: 4.34 x10E6/uL (ref 3.77–5.28)
RDW: 14.2 % (ref 12.3–15.4)
WBC: 10.9 10*3/uL — ABNORMAL HIGH (ref 3.4–10.8)

## 2015-12-27 LAB — ABO AND RH: Rh Factor: POSITIVE

## 2015-12-27 LAB — SICKLE CELL SCREEN: SICKLE CELL SCREEN: NEGATIVE

## 2015-12-27 LAB — THYROID PANEL WITH TSH
Free Thyroxine Index: 1.5 (ref 1.2–4.9)
T3 Uptake Ratio: 17 % — ABNORMAL LOW (ref 24–39)
T4, Total: 8.7 ug/dL (ref 4.5–12.0)
TSH: 2.1 u[IU]/mL (ref 0.450–4.500)

## 2015-12-27 LAB — RPR: RPR Ser Ql: NONREACTIVE

## 2015-12-27 LAB — HIV ANTIBODY (ROUTINE TESTING W REFLEX): HIV Screen 4th Generation wRfx: NONREACTIVE

## 2015-12-27 LAB — VARICELLA ZOSTER ANTIBODY, IGG: VARICELLA: 1076 {index} (ref >165)

## 2015-12-27 MED ORDER — CITRANATAL HARMONY 27-1-260 MG PO CAPS: 27.0000 mg | ORAL_CAPSULE | Freq: Every day | ORAL | 11 refills | Status: DC

## 2015-12-28 ENCOUNTER — Encounter: Payer: Self-pay | Admitting: Obstetrics and Gynecology

## 2015-12-28 DIAGNOSIS — O09299 Supervision of pregnancy with other poor reproductive or obstetric history, unspecified trimester: Secondary | ICD-10-CM | POA: Insufficient documentation

## 2015-12-28 DIAGNOSIS — O0991 Supervision of high risk pregnancy, unspecified, first trimester: Secondary | ICD-10-CM | POA: Insufficient documentation

## 2015-12-28 DIAGNOSIS — Z8632 Personal history of gestational diabetes: Secondary | ICD-10-CM

## 2015-12-28 DIAGNOSIS — O99210 Obesity complicating pregnancy, unspecified trimester: Secondary | ICD-10-CM | POA: Insufficient documentation

## 2015-12-28 DIAGNOSIS — F17201 Nicotine dependence, unspecified, in remission: Secondary | ICD-10-CM | POA: Insufficient documentation

## 2015-12-28 LAB — GLUCOSE, 1 HOUR GESTATIONAL: Gestational Diabetes Screen: 136 mg/dL (ref 65–139)

## 2015-12-30 LAB — URINALYSIS, ROUTINE W REFLEX MICROSCOPIC
Bilirubin, UA: NEGATIVE
GLUCOSE, UA: NEGATIVE
KETONES UA: NEGATIVE
Leukocytes, UA: NEGATIVE
NITRITE UA: NEGATIVE
Protein, UA: NEGATIVE
RBC UA: NEGATIVE
SPEC GRAV UA: 1.021 (ref 1.005–1.030)
UUROB: 0.2 mg/dL (ref 0.2–1.0)
pH, UA: 6 (ref 5.0–7.5)

## 2015-12-30 LAB — MONITOR DRUG PROFILE 14(MW)
Amphetamine Scrn, Ur: NEGATIVE ng/mL
BARBITURATE SCREEN URINE: NEGATIVE ng/mL
BENZODIAZEPINE SCREEN, URINE: NEGATIVE ng/mL
Buprenorphine, Urine: NEGATIVE ng/mL
COCAINE(METAB.)SCREEN, URINE: NEGATIVE ng/mL
CREATININE(CRT), U: 191 mg/dL (ref 20.0–300.0)
Fentanyl, Urine: NEGATIVE pg/mL
MEPERIDINE SCREEN, URINE: NEGATIVE ng/mL
METHADONE SCREEN, URINE: NEGATIVE ng/mL
OXYCODONE+OXYMORPHONE UR QL SCN: NEGATIVE ng/mL
Opiate Scrn, Ur: NEGATIVE ng/mL
Ph of Urine: 5.9 (ref 4.5–8.9)
Phencyclidine Qn, Ur: NEGATIVE ng/mL
Propoxyphene Scrn, Ur: NEGATIVE ng/mL
SPECIFIC GRAVITY: 1.022
Tramadol Screen, Urine: NEGATIVE ng/mL

## 2015-12-30 LAB — URINE CULTURE

## 2015-12-30 LAB — NICOTINE SCREEN, URINE: COTININE UR QL SCN: NEGATIVE ng/mL

## 2015-12-30 LAB — CANNABINOID (GC/MS), URINE
CARBOXY THC UR: 27 ng/mL
Cannabinoid: POSITIVE — AB

## 2015-12-30 LAB — GC/CHLAMYDIA PROBE AMP
Chlamydia trachomatis, NAA: POSITIVE — AB
Neisseria gonorrhoeae by PCR: NEGATIVE

## 2015-12-31 LAB — FIRST TRIMESTER SCREEN W/NT
CRL: 70 mm
DIA MoM: 1.07
DIA Value: 182.8 pg/mL
Gest Age-Collect: 13 weeks
HCG MOM: 1.04
MATERNAL AGE AT EDD: 27.9 a
NUCHAL TRANSLUCENCY MOM: 1.04
NUCHAL TRANSLUCENCY: 1.8 mm
Number of Fetuses: 1
PAPP-A MoM: 0.93
PAPP-A VALUE: 585.4 ng/mL
Test Results:: NEGATIVE
WEIGHT: 243 [lb_av]
hCG Value: 63.6 IU/mL

## 2016-01-02 ENCOUNTER — Emergency Department
Admission: EM | Admit: 2016-01-02 | Discharge: 2016-01-02 | Disposition: A | Discharge status: Home / Self Care | Payer: Medicaid Other | Attending: Emergency Medicine | Admitting: Emergency Medicine

## 2016-01-02 DIAGNOSIS — Y999 Unspecified external cause status: Secondary | ICD-10-CM | POA: Insufficient documentation

## 2016-01-02 DIAGNOSIS — Y939 Activity, unspecified: Secondary | ICD-10-CM | POA: Diagnosis not present

## 2016-01-02 DIAGNOSIS — Y929 Unspecified place or not applicable: Secondary | ICD-10-CM | POA: Insufficient documentation

## 2016-01-02 DIAGNOSIS — Z87891 Personal history of nicotine dependence: Secondary | ICD-10-CM | POA: Diagnosis not present

## 2016-01-02 DIAGNOSIS — W57XXXA Bitten or stung by nonvenomous insect and other nonvenomous arthropods, initial encounter: Secondary | ICD-10-CM | POA: Insufficient documentation

## 2016-01-02 DIAGNOSIS — L03115 Cellulitis of right lower limb: Secondary | ICD-10-CM | POA: Diagnosis not present

## 2016-01-02 DIAGNOSIS — S70361A Insect bite (nonvenomous), right thigh, initial encounter: Secondary | ICD-10-CM | POA: Diagnosis present

## 2016-01-02 MED ORDER — SULFAMETHOXAZOLE-TRIMETHOPRIM 800-160 MG PO TABS: 1.0000 | ORAL_TABLET | Freq: Two times a day (BID) | ORAL | 0 refills | Status: DC

## 2016-01-02 MED ORDER — CEPHALEXIN 500 MG PO CAPS: 500.0000 mg | ORAL_CAPSULE | Freq: Four times a day (QID) | ORAL | 0 refills | Status: AC

## 2016-01-02 NOTE — ED Notes (Signed)
Pt alert and oriented X4, active, cooperative, pt in NAD. RR even and unlabored, color WNL.  Pt informed to return if any life threatening symptoms occur.   

## 2016-01-02 NOTE — ED Triage Notes (Signed)
Pt belives that she was bitten by a spider X 3 days ago.

## 2016-01-02 NOTE — Discharge Instructions (Signed)
Please apply warm compresses to the right inner thigh. Return to the ER for any increased redness swelling or drainage. Take medications as prescribed.

## 2016-01-02 NOTE — ED Provider Notes (Signed)
[facility]-EMERGENCY DEPARTMENT Provider Note   CSN: [id number] Arrival date & time: 01/02/16  1623     History   Chief Complaint Chief Complaint  Patient presents with  . Insect Bite    HPI Marie Palmer is a 25 y.o. female presents to the emergency department for evaluation of right thigh redness and pain. Patient states 3 days ago she developed redness and swelling to the right mid inner thigh. No fevers. She denies any bug bites. Pain is moderate. She's had no drainage. She is taking Tylenol for pain. She is [redacted] weeks pregnant. Denies any vaginal bleeding or discharge.  HPI  Past Medical History:  Diagnosis Date  . History of gestational diabetes   . History of pre-eclampsia   . Migraines     Patient Active Problem List   Diagnosis Date Noted  . Obesity in pregnancy 12/28/2015  . H/O gestational diabetes in prior pregnancy, currently pregnant 12/28/2015  . H/O pre-eclampsia in prior pregnancy, currently pregnant 12/28/2015  . Supervision of high risk pregnancy in first trimester 12/28/2015  . Tobacco use disorder, mild, in sustained remission 12/28/2015    Past Surgical History:  Procedure Laterality Date  . CHOLECYSTECTOMY      OB History    Gravida Para Term Preterm AB Living   3 2 1 1   2    SAB TAB Ectopic Multiple Live Births           2       Home Medications    Prior to Admission medications   Medication Sig Start Date End Date Taking? Authorizing Provider  cephALEXin (KEFLEX) 500 MG capsule Take 1 capsule (500 mg total) by mouth 4 (four) times daily. 01/02/16 01/12/16  Evon Slackhomas C Gaines, PA-C  Prenat-FeFmCb-DSS-FA-DHA w/o A (CITRANATAL HARMONY) 27-1-260 MG CAPS Take 27 mg by mouth daily. 12/27/15   Hildred LaserAnika Cherry, MD    Family History No family history on file.  Social History Social History  Substance Use Topics  . Smoking status: Former Smoker    Packs/day: 0.25    Quit date: 10/23/2015  . Smokeless tobacco: Never Used     Comment: cessation  after discovery of pregnancy  . Alcohol use No     Allergies   Review of patient's allergies indicates no known allergies.   Review of Systems Review of Systems  Constitutional: Negative for chills and fever.  HENT: Negative for ear pain and sore throat.   Eyes: Negative for pain and visual disturbance.  Respiratory: Negative for cough and shortness of breath.   Cardiovascular: Negative for chest pain and palpitations.  Gastrointestinal: Negative for abdominal pain and vomiting.  Genitourinary: Negative for dysuria and hematuria.  Musculoskeletal: Negative for arthralgias and back pain.  Skin: Positive for rash. Negative for color change and wound.  Neurological: Negative for seizures and syncope.  Psychiatric/Behavioral: Negative for self-injury.  All other systems reviewed and are negative.    Physical Exam Updated Vital Signs BP (!) 145/80 (BP Location: Left Arm)   Pulse (!) 102   Temp 99 F (37.2 C) (Oral)   Resp 18   Ht 5\' 8"  (1.727 m)   Wt 101.6 kg   LMP 09/29/2015   SpO2 98%   BMI 34.06 kg/m   Physical Exam  Constitutional: She appears well-developed and well-nourished. No distress.  HENT:  Head: Normocephalic and atraumatic.  Eyes: Conjunctivae are normal.  Neck: Neck supple.  Cardiovascular: Normal rate and regular rhythm.   No murmur heard. Pulmonary/Chest: Effort normal  and breath sounds normal. No respiratory distress.  Abdominal: Soft. There is no tenderness. There is no guarding.  Musculoskeletal: She exhibits no edema.  No edema throughout the lower extremities. Negative Homans sign bilaterally.  Neurological: She is alert.  Skin: Skin is warm and dry.  Examination of the right inner thigh shows a 4 x 4 centimeter area of erythema that is circumferential with centralized small pustules x 3 that are 2 mm in diameter. Pustules are due removed with no significant drainage. Cleaned with alcohol and a Band-Aid applied. There is no fluctuance to the  area. Minimal tenderness to palpation.  Psychiatric: She has a normal mood and affect. Judgment and thought content normal.  Nursing note and vitals reviewed.    ED Treatments / Results  Labs (all labs ordered are listed, but only abnormal results are displayed) Labs Reviewed - No data to display  EKG  EKG Interpretation None       Radiology No results found.  Procedures Procedures (including critical care time)  Medications Ordered in ED Medications - No data to display   Initial Impression / Assessment and Plan / ED Course  I have reviewed the triage vital signs and the nursing notes.  Pertinent labs & imaging results that were available during my care of the patient were reviewed by me and considered in my medical decision making (see chart for details).  Clinical Course    25 year old female with non-purulent cellulitis to the right inner thigh. Possible insect bite. No fluctuance. She is placed on cephalexin for 7 days. She will apply warm compresses. She is educated on signs and symptoms to return to the emergency department for.  Final Clinical Impressions(s) / ED Diagnoses   Final diagnoses:  Insect bite, initial encounter  Cellulitis of right lower extremity    New Prescriptions Discharge Medication List as of 01/02/2016  5:43 PM    START taking these medications   Details  cephALEXin (KEFLEX) 500 MG capsule Take 1 capsule (500 mg total) by mouth 4 (four) times daily., Starting Wed 01/02/2016, Until Sat 01/12/2016, Print         Evon Slack, PA-C 01/02/16 1753    Marie Sickle, MD 01/02/16 1911

## 2016-01-04 LAB — PAP IG, CT-NG, RFX HPV ASCU
CHLAMYDIA, NUC. ACID AMP: POSITIVE — AB
Gonococcus by Nucleic Acid Amp: NEGATIVE
PAP SMEAR COMMENT: 0

## 2016-01-07 ENCOUNTER — Telehealth: Payer: Self-pay

## 2016-01-07 DIAGNOSIS — O98312 Other infections with a predominantly sexual mode of transmission complicating pregnancy, second trimester: Principal | ICD-10-CM

## 2016-01-07 DIAGNOSIS — A568 Sexually transmitted chlamydial infection of other sites: Secondary | ICD-10-CM

## 2016-01-07 DIAGNOSIS — A749 Chlamydial infection, unspecified: Secondary | ICD-10-CM

## 2016-01-07 MED ORDER — AZITHROMYCIN 500 MG PO TABS: ORAL_TABLET | ORAL | 0 refills | Status: DC

## 2016-01-07 NOTE — Telephone Encounter (Signed)
-----   Message from Hildred LaserAnika Cherry, MD sent at 01/07/2016 10:00 AM EDT ----- Please inform patient that pap smear was normal, however she did test positive for Chlamydia. Needs treatment with Azithromycin 2 grams PO x 1 dose (with 1 refill in case she gets sick, or can let partner use).  Advise on safe sex practices and partner testing and treatment.  I did not yet release this to Mychart.

## 2016-01-07 NOTE — Telephone Encounter (Signed)
Called pt informed her of normal PAP and +chlaymdia. Pt states that her boyfriend was treated yesterday in the ED. Advised pt on no intercourse x 7 days. RX sent in. Will do TOC at next visit. Pt gave verbal understanding.

## 2016-01-23 ENCOUNTER — Ambulatory Visit (INDEPENDENT_AMBULATORY_CARE_PROVIDER_SITE_OTHER): Payer: Medicaid Other | Admitting: Obstetrics and Gynecology

## 2016-01-23 VITALS — BP 116/69 | HR 88 | Wt 244.8 lb

## 2016-01-23 DIAGNOSIS — O09299 Supervision of pregnancy with other poor reproductive or obstetric history, unspecified trimester: Secondary | ICD-10-CM

## 2016-01-23 DIAGNOSIS — O98311 Other infections with a predominantly sexual mode of transmission complicating pregnancy, first trimester: Secondary | ICD-10-CM

## 2016-01-23 DIAGNOSIS — O0992 Supervision of high risk pregnancy, unspecified, second trimester: Secondary | ICD-10-CM

## 2016-01-23 DIAGNOSIS — Z8632 Personal history of gestational diabetes: Secondary | ICD-10-CM

## 2016-01-23 DIAGNOSIS — O98811 Other maternal infectious and parasitic diseases complicating pregnancy, first trimester: Secondary | ICD-10-CM

## 2016-01-23 DIAGNOSIS — A749 Chlamydial infection, unspecified: Secondary | ICD-10-CM

## 2016-01-23 DIAGNOSIS — G479 Sleep disorder, unspecified: Secondary | ICD-10-CM

## 2016-01-23 DIAGNOSIS — O99210 Obesity complicating pregnancy, unspecified trimester: Secondary | ICD-10-CM

## 2016-01-23 LAB — POCT URINALYSIS DIPSTICK
BILIRUBIN UA: NEGATIVE
GLUCOSE UA: NEGATIVE
Ketones, UA: NEGATIVE
Nitrite, UA: NEGATIVE
Protein, UA: NEGATIVE
SPEC GRAV UA: 1.025
UROBILINOGEN UA: NEGATIVE
pH, UA: 6

## 2016-01-23 MED ORDER — ASPIRIN EC 81 MG PO TBEC: 81.0000 mg | DELAYED_RELEASE_TABLET | Freq: Every day | ORAL | 2 refills | Status: DC
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
## 2016-01-27 DIAGNOSIS — A749 Chlamydial infection, unspecified: Secondary | ICD-10-CM | POA: Insufficient documentation

## 2016-01-27 DIAGNOSIS — O98811 Other maternal infectious and parasitic diseases complicating pregnancy, first trimester: Secondary | ICD-10-CM

## 2016-02-06 ENCOUNTER — Ambulatory Visit (INDEPENDENT_AMBULATORY_CARE_PROVIDER_SITE_OTHER): Payer: Medicaid Other

## 2016-02-06 DIAGNOSIS — O0992 Supervision of high risk pregnancy, unspecified, second trimester: Secondary | ICD-10-CM

## 2016-02-18 ENCOUNTER — Other Ambulatory Visit: Payer: Self-pay | Admitting: Obstetrics and Gynecology

## 2016-02-18 DIAGNOSIS — Z0489 Encounter for examination and observation for other specified reasons: Secondary | ICD-10-CM

## 2016-02-18 DIAGNOSIS — IMO0002 Reserved for concepts with insufficient information to code with codable children: Secondary | ICD-10-CM

## 2016-02-20 ENCOUNTER — Other Ambulatory Visit: Payer: Medicaid Other

## 2016-02-20 ENCOUNTER — Ambulatory Visit (INDEPENDENT_AMBULATORY_CARE_PROVIDER_SITE_OTHER): Payer: Medicaid Other

## 2016-02-20 ENCOUNTER — Ambulatory Visit (INDEPENDENT_AMBULATORY_CARE_PROVIDER_SITE_OTHER): Payer: Medicaid Other | Admitting: Obstetrics and Gynecology

## 2016-02-20 VITALS — BP 110/71 | HR 80 | Wt 248.5 lb

## 2016-02-20 DIAGNOSIS — Z362 Encounter for other antenatal screening follow-up: Secondary | ICD-10-CM

## 2016-02-20 DIAGNOSIS — O99210 Obesity complicating pregnancy, unspecified trimester: Secondary | ICD-10-CM

## 2016-02-20 DIAGNOSIS — IMO0002 Reserved for concepts with insufficient information to code with codable children: Secondary | ICD-10-CM

## 2016-02-20 DIAGNOSIS — O0992 Supervision of high risk pregnancy, unspecified, second trimester: Secondary | ICD-10-CM

## 2016-02-20 DIAGNOSIS — G479 Sleep disorder, unspecified: Secondary | ICD-10-CM | POA: Insufficient documentation

## 2016-02-20 DIAGNOSIS — O09892 Supervision of other high risk pregnancies, second trimester: Secondary | ICD-10-CM

## 2016-02-20 DIAGNOSIS — O09299 Supervision of pregnancy with other poor reproductive or obstetric history, unspecified trimester: Secondary | ICD-10-CM

## 2016-02-20 DIAGNOSIS — Z0489 Encounter for examination and observation for other specified reasons: Secondary | ICD-10-CM

## 2016-02-20 DIAGNOSIS — O09292 Supervision of pregnancy with other poor reproductive or obstetric history, second trimester: Secondary | ICD-10-CM

## 2016-02-20 LAB — POCT URINALYSIS DIPSTICK
BILIRUBIN UA: NEGATIVE
Blood, UA: NEGATIVE
Glucose, UA: NEGATIVE
KETONES UA: NEGATIVE
LEUKOCYTES UA: NEGATIVE
NITRITE UA: NEGATIVE
PH UA: 6
PROTEIN UA: NEGATIVE
Spec Grav, UA: 1.02
Urobilinogen, UA: NEGATIVE

## 2016-02-20 MED ORDER — HYDROXYZINE HCL 25 MG PO TABS: 25.0000 mg | ORAL_TABLET | Freq: Three times a day (TID) | ORAL | 0 refills | Status: DC | PRN

## 2016-02-20 NOTE — Progress Notes (Deleted)
ROB: Doing well, no complaints. S/p f/u ultrasound for incomplete anatomy, normal. RTC in 4 weeks.

## 2016-02-20 NOTE — Progress Notes (Signed)
ROB: Patient notes round ligament pain.  Also still noting difficulty sleeping despite OTC sleep aids.  Advised on Tylenol, warm baths, and prescribed Hydroxyzine for sleep. S/p f/u ultrasound for incomplete anatomy, normal.  For TOC for Chlamydia infection today.  Discussed exercise in pregnancy. RTC in 4 weeks.

## 2016-03-17 NOTE — L&D Delivery Note (Signed)
     Delivery Note   Marie Palmer is a 26 y.o. [id number] at [redacted]w[redacted]d Estimated Date of Delivery: 07/05/16  PRE-OPERATIVE DIAGNOSIS:  1) [redacted]w[redacted]d pregnancy.  2)  Gestational DM on medication   POST-OPERATIVE DIAGNOSIS:  1) [redacted]w[redacted]d pregnancy s/p Vaginal, Spontaneous Delivery   Delivery Type: Vaginal, Spontaneous Delivery    Delivery Clinician: Linzie Collin   Delivery Anesthesia: Epidural   Labor Complications:     Additional complications: mild shoulder dystocia - McRoberts and then delivery of posterior arm  ESTIMATED BLOOD LOSS: 100  ml    FINDINGS:   1) female infant, Apgar scores of 8    at 1 minute and 8    at 5 minutes and a birthweight of 132.63  ounces.    2) Nuchal cord: No  SPECIMENS:   PLACENTA:   Appearance: Intact    Removal: Spontaneous      Disposition:     DISPOSITION:  Infant to left in stable condition in the delivery room, with L&D personnel and mother,  NARRATIVE SUMMARY: Labor course:  [name] is a [id number] at [redacted]w[redacted]d who presented for induction of labor.  She began with cytotec last night and then today she progressed well in labor with pitocin.  She received the appropriate anesthesia and proceeded to complete dilation. She evidenced good maternal expulsive effort during the second stage and had an uncontrollable urge to push.  She went on to deliver a viable infant. The placenta delivered without problems and was noted to be complete. A perineal and vaginal examination was performed. Episiotomy/Lacerations: None     [name], M.D. 06/29/2016 9:25 AM

## 2016-03-19 ENCOUNTER — Encounter: Payer: Medicaid Other | Admitting: Obstetrics and Gynecology

## 2016-03-25 ENCOUNTER — Encounter: Payer: Medicaid Other | Admitting: Obstetrics and Gynecology

## 2016-03-28 ENCOUNTER — Ambulatory Visit (INDEPENDENT_AMBULATORY_CARE_PROVIDER_SITE_OTHER): Payer: Medicaid Other | Admitting: Certified Nurse Midwife

## 2016-03-28 VITALS — BP 110/77 | HR 93 | Wt 253.1 lb

## 2016-03-28 DIAGNOSIS — Z113 Encounter for screening for infections with a predominantly sexual mode of transmission: Secondary | ICD-10-CM

## 2016-03-28 DIAGNOSIS — Z3492 Encounter for supervision of normal pregnancy, unspecified, second trimester: Secondary | ICD-10-CM

## 2016-03-28 LAB — POCT URINALYSIS DIPSTICK
Glucose, UA: NEGATIVE
PROTEIN UA: NEGATIVE
SPEC GRAV UA: 1.02
pH, UA: 5

## 2016-03-28 NOTE — Patient Instructions (Addendum)
Eating Plan for Pregnant Women Introduction While you are pregnant, your body will require additional nutrition to help support your growing baby. It is recommended that you consume:  150 additional calories each day during your first trimester.  300 additional calories each day during your second trimester.  300 additional calories each day during your third trimester. Eating a healthy, well-balanced diet is very important for your health and for your baby's health. You also have a higher need for some vitamins and minerals, such as folic acid, calcium, iron, and vitamin D. What do I need to know about eating during pregnancy?  Do not try to lose weight or go on a diet during pregnancy.  Choose healthy, nutritious foods. Choose  of a sandwich with a glass of milk instead of a candy bar or a high-calorie sugar-sweetened beverage.  Limit your overall intake of foods that have "empty calories." These are foods that have little nutritional value, such as sweets, desserts, candies, sugar-sweetened beverages, and fried foods.  Eat a variety of foods, especially fruits and vegetables.  Take a prenatal vitamin to help meet the additional needs during pregnancy, specifically for folic acid, iron, calcium, and vitamin D.  Remember to stay active. Ask your health care provider for exercise recommendations that are specific to you.  Practice good food safety and cleanliness, such as washing your hands before you eat and after you prepare raw meat. This helps to prevent foodborne illnesses, such as listeriosis, that can be very dangerous for your baby. Ask your health care provider for more information about listeriosis. What does 150 extra calories look like? Healthy options for an additional 150 calories each day could be any of the following:  Plain low-fat yogurt (6-8 oz) with  cup of berries.  1 apple with 2 teaspoons of peanut butter.  Cut-up vegetables with  cup of hummus.  Low-fat  chocolate milk (8 oz or 1 cup).  1 string cheese with 1 medium orange.   of a peanut butter and jelly sandwich on whole-wheat bread (1 tsp of peanut butter). For 300 calories, you could eat two of those healthy options each day. What is a healthy amount of weight to gain? The recommended amount of weight for you to gain is based on your pre-pregnancy BMI. If your pre-pregnancy BMI was:  Less than 18 (underweight), you should gain 28-40 lb.  18-24.9 (normal), you should gain 25-35 lb.  25-29.9 (overweight), you should gain 15-25 lb.  Greater than 30 (obese), you should gain 11-20 lb. What if I am having twins or multiples? Generally, pregnant women who will be having twins or multiples may need to increase their daily calories by 300-600 calories each day. The recommended range for total weight gain is 25-54 lb, depending on your pre-pregnancy BMI. Talk with your health care provider for specific guidance about additional nutritional needs, weight gain, and exercise during your pregnancy. What foods can I eat? Grains  Any grains. Try to choose whole grains, such as whole-wheat bread, oatmeal, or brown rice. Vegetables  Any vegetables. Try to eat a variety of colors and types of vegetables to get a full range of vitamins and minerals. Remember to wash your vegetables well before eating. Fruits  Any fruits. Try to eat a variety of colors and types of fruit to get a full range of vitamins and minerals. Remember to wash your fruits well before eating. Meats and Other Protein Sources  Lean meats, including chicken, [place], fish, and lean cuts of  beef, veal, or pork. Make sure that all meats are cooked to "well done." Tofu. Tempeh. Beans. Eggs. Peanut butter and other nut butters. Seafood, such as shrimp, crab, and lobster. If you choose fish, select types that are higher in omega-3 fatty acids, including salmon, herring, mussels, trout, sardines, and pollock. Make sure that all meats are cooked  to food-safe temperatures. Dairy  Pasteurized milk and milk alternatives. Pasteurized yogurt and pasteurized cheese. Cottage cheese. Sour cream. Beverages  Water. Juices that contain 100% fruit juice or vegetable juice. Caffeine-free teas and decaffeinated coffee. Drinks that contain caffeine are okay to drink, but it is better to avoid caffeine. Keep your total caffeine intake to less than 200 mg each day (12 oz of coffee, tea, or soda) or as directed by your health care provider. Condiments  Any pasteurized condiments. Sweets and Desserts  Any sweets and desserts. Fats and Oils  Any fats and oils. The items listed above may not be a complete list of recommended foods or beverages. Contact your dietitian for more options.  What foods are not recommended? Vegetables  Unpasteurized (raw) vegetable juices. Fruits  Unpasteurized (raw) fruit juices. Meats and Other Protein Sources  Cured meats that have nitrates, such as bacon, salami, and hotdogs. Luncheon meats, bologna, or other deli meats (unless they are reheated until they are steaming hot). Refrigerated pate, meat spreads from a meat counter, smoked seafood that is found in the refrigerated section of a store. Raw fish, such as sushi or sashimi. High mercury content fish, such as tilefish, shark, swordfish, and king mackerel. Raw meats, such as tuna or beef tartare. Undercooked meats and poultry. Make sure that all meats are cooked to food-safe temperatures. Dairy  Unpasteurized (raw) milk and any foods that have raw milk in them. Soft cheeses, such as feta, queso blanco, queso fresco, Brie, Camembert cheeses, blue-veined cheeses, and Panela cheese (unless it is made with pasteurized milk, which must be stated on the label). Beverages  Alcohol. Sugar-sweetened beverages, such as sodas, teas, or energy drinks. Condiments  Homemade fermented foods and drinks, such as pickles, sauerkraut, or kombucha drinks. (Store-bought pasteurized versions  of these are okay.) Other  Salads that are made in the store, such as ham salad, chicken salad, egg salad, tuna salad, and seafood salad. The items listed above may not be a complete list of foods and beverages to avoid. Contact your dietitian for more information.  This information is not intended to replace advice given to you by your health care provider. Make sure you discuss any questions you have with your health care provider. Document Released: 12/16/2013 Document Revised: 08/09/2015 Document Reviewed: 08/16/2013  2017 Elsevier

## 2016-03-28 NOTE — Progress Notes (Signed)
Pt is requesting STD testing.

## 2016-03-28 NOTE — Progress Notes (Signed)
ROB-Doing well pregnancy wise. Discussed healthy eating, weight gain, and exercise in pregnancy. Pt is requesting additional STD testing. FOB has history of STDs, but they have broken up. Pt is talking to someone new and wants to make sure "everything is clear".

## 2016-03-30 LAB — CHLAMYDIA/GONOCOCCUS/TRICHOMONAS, NAA
Chlamydia by NAA: NEGATIVE
GONOCOCCUS BY NAA: NEGATIVE
TRICH VAG BY NAA: NEGATIVE

## 2016-04-11 ENCOUNTER — Ambulatory Visit (INDEPENDENT_AMBULATORY_CARE_PROVIDER_SITE_OTHER): Payer: Medicaid Other | Admitting: Certified Nurse Midwife

## 2016-04-11 ENCOUNTER — Other Ambulatory Visit: Payer: Medicaid Other

## 2016-04-11 VITALS — BP 122/80 | HR 84 | Wt 257.8 lb

## 2016-04-11 DIAGNOSIS — Z130 Encounter for screening for diseases of the blood and blood-forming organs and certain disorders involving the immune mechanism: Secondary | ICD-10-CM

## 2016-04-11 DIAGNOSIS — Z3482 Encounter for supervision of other normal pregnancy, second trimester: Secondary | ICD-10-CM

## 2016-04-11 DIAGNOSIS — Z131 Encounter for screening for diabetes mellitus: Secondary | ICD-10-CM

## 2016-04-11 LAB — POCT URINALYSIS DIPSTICK
BILIRUBIN UA: NEGATIVE
Glucose, UA: NEGATIVE
KETONES UA: NEGATIVE
Nitrite, UA: NEGATIVE
PH UA: 7
Protein, UA: NEGATIVE
RBC UA: NEGATIVE
SPEC GRAV UA: 1.015
Urobilinogen, UA: NEGATIVE

## 2016-04-11 NOTE — Progress Notes (Signed)
Marie Palmer-[facility] for possible sprained rotator cuff and given Flexeril and lidocaine patches. Discussed round ligament pain and home treatment measures. Glucola today. RTC x 2 weeks for Marie Palmer.

## 2016-04-11 NOTE — Patient Instructions (Signed)

## 2016-04-12 LAB — HEMOGLOBIN AND HEMATOCRIT, BLOOD
Hematocrit: 33.8 % — ABNORMAL LOW (ref 34.0–46.6)
Hemoglobin: 11.1 g/dL (ref 11.1–15.9)

## 2016-04-12 LAB — GLUCOSE, 1 HOUR GESTATIONAL: Gestational Diabetes Screen: 177 mg/dL — ABNORMAL HIGH (ref 65–139)

## 2016-04-16 ENCOUNTER — Other Ambulatory Visit: Payer: Self-pay | Admitting: Certified Nurse Midwife

## 2016-04-16 ENCOUNTER — Other Ambulatory Visit: Payer: Medicaid Other

## 2016-04-17 LAB — GESTATIONAL GLUCOSE TOLERANCE
GLUCOSE 1 HOUR GTT: 155 mg/dL (ref 65–179)
GLUCOSE 2 HOUR GTT: 151 mg/dL (ref 65–154)
Glucose, Fasting: 149 mg/dL — ABNORMAL HIGH (ref 65–94)
Glucose, GTT - 3 Hour: 155 mg/dL — ABNORMAL HIGH (ref 65–139)

## 2016-04-18 ENCOUNTER — Telehealth: Payer: Self-pay

## 2016-04-18 NOTE — Telephone Encounter (Signed)
Called pt to ensure that she had completed 3hr gtt and pt has, came in this am.

## 2016-04-21 ENCOUNTER — Telehealth: Payer: Self-pay | Admitting: Obstetrics and Gynecology

## 2016-04-21 NOTE — Telephone Encounter (Signed)
PT CALLED AND SHE IS 30 WEEKS AND SHE IS HAVING A LOT OF LOWER PELVIC PRESSURE, STARTED OVER THE WEEKEND, SHE STATED ITS WHEN SHE SITS STANDS AND USES THE RESTROOM

## 2016-04-21 NOTE — Telephone Encounter (Signed)
Called pt she states that she is having a lot of pelvic pressure, unsure if she is dilating. Pt denies bleeding or leaking fluid. Pt denies contractions. Advised pt it is normal to have increased pelvic pressure as baby grows larger, encouraged pt to get on all fours for relief as well as may encourage baby to change positions. Pt gave verbal understanding. To call back if anything changes.

## 2016-04-23 ENCOUNTER — Telehealth: Payer: Self-pay | Admitting: Obstetrics and Gynecology

## 2016-04-23 DIAGNOSIS — O24410 Gestational diabetes mellitus in pregnancy, diet controlled: Secondary | ICD-10-CM

## 2016-04-23 NOTE — Telephone Encounter (Signed)
Called pt informed her of GDM dx. Placed referral for lifestyle center.

## 2016-04-23 NOTE — Telephone Encounter (Signed)
Marie Palmer had the 3 hr gtt last wed and noone has called her yet about if she passed it. Will you look and call her please.

## 2016-04-23 NOTE — Telephone Encounter (Signed)
It must still be in Michelle's box because I haven't seen her in a while.  But I did review them and her 3 hour was abnormal. She will need to go Lifestyles management.

## 2016-04-24 ENCOUNTER — Ambulatory Visit (INDEPENDENT_AMBULATORY_CARE_PROVIDER_SITE_OTHER): Payer: Medicaid Other | Admitting: Obstetrics and Gynecology

## 2016-04-24 ENCOUNTER — Encounter: Payer: Self-pay | Admitting: Obstetrics and Gynecology

## 2016-04-24 VITALS — BP 132/76 | HR 80 | Wt 259.0 lb

## 2016-04-24 DIAGNOSIS — O24419 Gestational diabetes mellitus in pregnancy, unspecified control: Secondary | ICD-10-CM

## 2016-04-24 DIAGNOSIS — O0993 Supervision of high risk pregnancy, unspecified, third trimester: Secondary | ICD-10-CM

## 2016-04-24 DIAGNOSIS — O26893 Other specified pregnancy related conditions, third trimester: Secondary | ICD-10-CM

## 2016-04-24 DIAGNOSIS — O09299 Supervision of pregnancy with other poor reproductive or obstetric history, unspecified trimester: Secondary | ICD-10-CM

## 2016-04-24 DIAGNOSIS — O99210 Obesity complicating pregnancy, unspecified trimester: Secondary | ICD-10-CM

## 2016-04-24 DIAGNOSIS — N949 Unspecified condition associated with female genital organs and menstrual cycle: Secondary | ICD-10-CM

## 2016-04-24 DIAGNOSIS — Z8632 Personal history of gestational diabetes: Secondary | ICD-10-CM

## 2016-04-24 LAB — POCT URINALYSIS DIPSTICK
Bilirubin, UA: NEGATIVE
Blood, UA: NEGATIVE
GLUCOSE UA: NEGATIVE
KETONES UA: NEGATIVE
Nitrite, UA: NEGATIVE
SPEC GRAV UA: 1.025
Urobilinogen, UA: NEGATIVE
pH, UA: 6

## 2016-04-24 NOTE — Progress Notes (Signed)
ROB: Patient complains of intense pelvic pressure.  Desires cervical check.  Cervix closed. Also notes she has Lifestyles class tomorrow for newly diagnosed GDM.  States she checked her BS 2 hrs after eating this morning and it was 187.  Discussed possibly requiring medications if she was not able to be managed with diet.  Reports being diet controlled GDM in her 1st pregnancy. BPs also slightly elevated for pt today.  Will continue to monitor as patient also with h/o pre-eclampsia in a prior pregnancy.  RTC in 2 weeks. Desires IUD for postpartum contraception.

## 2016-04-25 ENCOUNTER — Ambulatory Visit: Payer: Medicaid Other | Admitting: *Deleted

## 2016-04-29 ENCOUNTER — Ambulatory Visit: Payer: Medicaid Other | Admitting: Dietician

## 2016-05-07 ENCOUNTER — Encounter: Payer: Self-pay | Admitting: Dietician

## 2016-05-07 ENCOUNTER — Encounter: Payer: Medicaid Other | Attending: Obstetrics and Gynecology | Admitting: Dietician

## 2016-05-07 VITALS — BP 126/68 | Ht 68.0 in | Wt 262.8 lb

## 2016-05-07 DIAGNOSIS — O24410 Gestational diabetes mellitus in pregnancy, diet controlled: Secondary | ICD-10-CM

## 2016-05-07 DIAGNOSIS — Z713 Dietary counseling and surveillance: Secondary | ICD-10-CM | POA: Diagnosis not present

## 2016-05-07 DIAGNOSIS — O24419 Gestational diabetes mellitus in pregnancy, unspecified control: Secondary | ICD-10-CM | POA: Diagnosis not present

## 2016-05-07 NOTE — Patient Instructions (Signed)
Read booklet on Gestational Diabetes Follow Gestational Meal Planning Guidelines Complete a 3 Day Food Record and bring to next appointment Check blood sugars 4 x day - before breakfast and 2 hrs after every meal and record  Bring blood sugar log to all appointments Call MD for prescription for meter strips and lancets Strips:  Accuchek Guide test strips;  Lancets:  Accuchek Fast Clix lancets Walk 20-30 minutes at least 5 x week if permitted by MD Next appointment    05-14-16

## 2016-05-07 NOTE — Progress Notes (Signed)
Appt. Start Time: 0900 Appt. End Time: 1030  GDM Class 1 Diabetes Overview - define DM; state own type of DM; identify functions of pancreas and insulin; define insulin deficiency vs insulin resistance  Psychosocial - identify DM as a source of stress; state the effects of stress on BG control; verbalize appropriate stress management techniques; identify personal stress issues   Nutritional Management - describe effects of food on blood glucose; identify sources of carbohydrate, protein and fat; verbalize the importance of balance meals in controlling blood glucose; identify meals as well balanced or not; estimate servings of carbohydrate from menus; use food labels to identify servings size, content of carbohydrate, fiber, protein, fat, saturated fat and sodium; recognize food sources of fat, saturated fat, trans fat, sodium and verbalize goals for intake; describe healthful appropriate food choices when dining out   Exercise - describe the effects of exercise on blood glucose and importance of regular exercise in controlling diabetes; state a plan for personal exercise; verbalize contraindications for exercise  Medications - state name, dose, timing of currently prescribed medications; describe types of medications available for diabetes  Insulin Training - prepare and administer insulin accurately; state correct rotation pattern; verbalize safe and lawful needle disposal  Self-Monitoring - state importance of HBGM and demo procedure accurately; use HBGM results to effectively manage diabetes; identify importance of regular HbA1C testing and goals for results  Acute Complications/Sick Day Guidelines - recognize hyperglycemia and hypoglycemia with causes and effects; identify blood glucose results as high, low or in control; list steps in treating and preventing high and low blood glucose; state appropriate measure to manage blood glucose when ill (need for meds, HBGM plan, when to call physician,  need for fluids)  Chronic Complications/Foot, Skin, Eye Dental Care - identify possible long-term complications of diabetes (retinopathy, neuropathy, nephropathy, cardiovascular disease, infections); explain steps in prevention and treatment of chronic complications; state importance of daily self-foot exams; describe how to examine feet and what to look for; explain appropriate eye and dental care  Lifestyle Changes/Goals & Health/Community Resources - state benefits of making appropriate lifestyle changes; identify habits that need to change (meals, tobacco, alcohol); identify strategies to reduce risk factors for personal health; set goals for proper diabetes care; state need for and frequency of healthcare follow-up; describe appropriate community resources for good health (ADA, web sites, apps)   Pregnancy/Sexual Health - define gestational diabetes; state importance of good blood glucose control and birth control prior to pregnancy; state importance of good blood glucose control in preventing sexual problems (impotence, vaginal dryness, infections, loss of desire); state relationship of blood glucose control and pregnancy outcome; describe risk of maternal and fetal complications  Teaching Materials Used: Meter-Accuchek Guide  General Meal Planning Guidelines Daily Food Record Gestational Diabetes Booklet Gestational Video Goals for Healthy Pregnancy

## 2016-05-08 ENCOUNTER — Encounter: Payer: Self-pay | Admitting: Obstetrics and Gynecology

## 2016-05-08 ENCOUNTER — Ambulatory Visit (INDEPENDENT_AMBULATORY_CARE_PROVIDER_SITE_OTHER): Payer: Medicaid Other | Admitting: Obstetrics and Gynecology

## 2016-05-08 VITALS — BP 112/75 | HR 101 | Wt 263.3 lb

## 2016-05-08 DIAGNOSIS — O24419 Gestational diabetes mellitus in pregnancy, unspecified control: Secondary | ICD-10-CM

## 2016-05-08 DIAGNOSIS — O0993 Supervision of high risk pregnancy, unspecified, third trimester: Secondary | ICD-10-CM

## 2016-05-08 LAB — POCT URINALYSIS DIPSTICK
Bilirubin, UA: NEGATIVE
Glucose, UA: NEGATIVE
Ketones, UA: NEGATIVE
NITRITE UA: NEGATIVE
PH UA: 5
Protein, UA: NEGATIVE
RBC UA: NEGATIVE
SPEC GRAV UA: 1.025
UROBILINOGEN UA: NEGATIVE

## 2016-05-08 MED ORDER — ACCU-CHEK FASTCLIX LANCETS MISC: 1.0000 [IU] | Freq: Four times a day (QID) | 12 refills | Status: DC

## 2016-05-08 MED ORDER — GLUCOSE BLOOD VI STRP: ORAL_STRIP | 12 refills | Status: DC

## 2016-05-08 NOTE — Progress Notes (Signed)
Patient has begun diet and fingersticks were gestational diabetes. She started yesterday.

## 2016-05-08 NOTE — Addendum Note (Signed)
Addended by: Brooke DareSICK, Freddy Spadafora L on: 05/08/2016 11:14 AM   Modules accepted: Orders

## 2016-05-09 ENCOUNTER — Other Ambulatory Visit: Payer: Self-pay

## 2016-05-09 ENCOUNTER — Telehealth: Payer: Self-pay | Admitting: Obstetrics and Gynecology

## 2016-05-09 DIAGNOSIS — O24419 Gestational diabetes mellitus in pregnancy, unspecified control: Secondary | ICD-10-CM

## 2016-05-09 MED ORDER — ACCU-CHEK FASTCLIX LANCETS MISC: 1.0000 [IU] | Freq: Four times a day (QID) | 12 refills | Status: DC

## 2016-05-09 MED ORDER — GLUCOSE BLOOD VI STRP: ORAL_STRIP | 12 refills | Status: DC

## 2016-05-09 NOTE — Telephone Encounter (Signed)
Patient needs lancets and test strips sent to the [facility] on [address]. Thanks

## 2016-05-14 ENCOUNTER — Ambulatory Visit: Payer: Medicaid Other | Admitting: Dietician

## 2016-05-15 ENCOUNTER — Telehealth: Payer: Self-pay | Admitting: Obstetrics and Gynecology

## 2016-05-15 ENCOUNTER — Other Ambulatory Visit: Payer: Self-pay

## 2016-05-15 DIAGNOSIS — O24419 Gestational diabetes mellitus in pregnancy, unspecified control: Secondary | ICD-10-CM

## 2016-05-15 NOTE — Telephone Encounter (Signed)
Patient needs Accu check Guide test strips sent in. The ones previously sent are the wrong kind.Thanks

## 2016-05-16 MED ORDER — GLUCOSE BLOOD VI STRP: ORAL_STRIP | 12 refills | Status: DC

## 2016-05-16 MED ORDER — GLUCOSE BLOOD VI STRP: 1.0000 | ORAL_STRIP | 12 refills | Status: DC | PRN

## 2016-05-16 NOTE — Telephone Encounter (Signed)
Returned patients call- her glucose testing machine needs the Accucheck Guide test strips.

## 2016-05-22 ENCOUNTER — Encounter: Payer: Medicaid Other | Admitting: Obstetrics and Gynecology

## 2016-05-27 ENCOUNTER — Ambulatory Visit (INDEPENDENT_AMBULATORY_CARE_PROVIDER_SITE_OTHER): Payer: Medicaid Other | Admitting: Obstetrics and Gynecology

## 2016-05-27 VITALS — BP 115/74 | HR 108 | Wt 267.1 lb

## 2016-05-27 DIAGNOSIS — O99210 Obesity complicating pregnancy, unspecified trimester: Secondary | ICD-10-CM

## 2016-05-27 DIAGNOSIS — Z3483 Encounter for supervision of other normal pregnancy, third trimester: Secondary | ICD-10-CM

## 2016-05-27 DIAGNOSIS — Z3493 Encounter for supervision of normal pregnancy, unspecified, third trimester: Secondary | ICD-10-CM

## 2016-05-27 DIAGNOSIS — O24415 Gestational diabetes mellitus in pregnancy, controlled by oral hypoglycemic drugs: Secondary | ICD-10-CM

## 2016-05-27 LAB — POCT URINALYSIS DIPSTICK
BILIRUBIN UA: NEGATIVE
GLUCOSE UA: NEGATIVE
Ketones, UA: NEGATIVE
Nitrite, UA: NEGATIVE
RBC UA: NEGATIVE
SPEC GRAV UA: 1.03
Urobilinogen, UA: 0.2
pH, UA: 6

## 2016-05-27 MED ORDER — GLYBURIDE 2.5 MG PO TABS: 2.5000 mg | ORAL_TABLET | Freq: Two times a day (BID) | ORAL | 3 refills | Status: DC

## 2016-05-27 NOTE — Progress Notes (Signed)
ROB: Patient notes that she is feeling a lot of pressure, notes she feels the baby is getting to big.  Notes that the is checking blood sugars as recommend and following diet.  Fastings 90s-110, postprandials 115-150s.  Started on glyburide 2.5 mg BID.  Will also get growth scan for S>D.  Will now continue serial growth scans q 4 weeks due to being started on meds, and will need to begin antenatal testing. RTC in 1 week to recheck blood sugars. To d/c baby aspirin a next visit.

## 2016-05-28 ENCOUNTER — Telehealth: Payer: Self-pay | Admitting: Obstetrics and Gynecology

## 2016-05-28 NOTE — Telephone Encounter (Signed)
Patient called.

## 2016-05-30 ENCOUNTER — Encounter: Payer: Self-pay | Admitting: Dietician

## 2016-05-30 ENCOUNTER — Telehealth: Payer: Self-pay | Admitting: *Deleted

## 2016-05-30 DIAGNOSIS — O24410 Gestational diabetes mellitus in pregnancy, diet controlled: Secondary | ICD-10-CM

## 2016-05-30 MED ORDER — GLUCOSE BLOOD VI STRP: ORAL_STRIP | 12 refills | Status: DC

## 2016-05-30 NOTE — Telephone Encounter (Signed)
RX sent in

## 2016-05-30 NOTE — Progress Notes (Signed)
Patient has not returned messages left to reschedule her missed appointment. Sent discharge letter to MD.

## 2016-05-30 NOTE — Telephone Encounter (Signed)
Patient called and states that she needs a refill of her AccuCheck Guide test strips sent to her pharmacy. Her pharmacy is [facility] on [address]. Please advise. Thank you

## 2016-06-02 ENCOUNTER — Ambulatory Visit (INDEPENDENT_AMBULATORY_CARE_PROVIDER_SITE_OTHER): Payer: Medicaid Other

## 2016-06-02 DIAGNOSIS — Z3493 Encounter for supervision of normal pregnancy, unspecified, third trimester: Secondary | ICD-10-CM

## 2016-06-02 DIAGNOSIS — O24415 Gestational diabetes mellitus in pregnancy, controlled by oral hypoglycemic drugs: Secondary | ICD-10-CM

## 2016-06-03 ENCOUNTER — Ambulatory Visit (INDEPENDENT_AMBULATORY_CARE_PROVIDER_SITE_OTHER): Payer: Medicaid Other | Admitting: Obstetrics and Gynecology

## 2016-06-03 VITALS — BP 127/77 | HR 87 | Wt 271.1 lb

## 2016-06-03 DIAGNOSIS — O99210 Obesity complicating pregnancy, unspecified trimester: Secondary | ICD-10-CM

## 2016-06-03 DIAGNOSIS — Z3483 Encounter for supervision of other normal pregnancy, third trimester: Secondary | ICD-10-CM | POA: Diagnosis not present

## 2016-06-03 DIAGNOSIS — O24415 Gestational diabetes mellitus in pregnancy, controlled by oral hypoglycemic drugs: Secondary | ICD-10-CM

## 2016-06-03 LAB — POCT URINALYSIS DIPSTICK
Bilirubin, UA: NEGATIVE
Blood, UA: NEGATIVE
Glucose, UA: NEGATIVE
KETONES UA: NEGATIVE
LEUKOCYTES UA: NEGATIVE
Nitrite, UA: NEGATIVE
PH UA: 6 (ref 5.0–8.0)
PROTEIN UA: NEGATIVE
SPEC GRAV UA: 1.02 (ref 1.030–1.035)
Urobilinogen, UA: NEGATIVE (ref ?–2.0)

## 2016-06-03 NOTE — Progress Notes (Signed)
ROB: Patient noting increased back pain in pregnancy, "ready for it to be over".  S/p growth scan yesterday for S>D, growth noted at 82%ile, normal AFI.  Patient notes she forgot blood sugar log at home today, but blood sugars are 135 or less postprandial, and 90s or less fasting.  Notes some GI intolerance with Glyburide, but declines being changed to insulin.  Discussed management with OTC nausea meds. NST performed today was reviewed and was found to be reactive.  Continue recommended antenatal testing and prenatal care.  Discussed plannned IOL at [redacted] weeks gestation.  Plans to breastfeed, unsure of contraception but considering Mirena IUD. Discussed cord blood banking, signed blood consent. RTC in 1 week.

## 2016-06-10 ENCOUNTER — Ambulatory Visit (INDEPENDENT_AMBULATORY_CARE_PROVIDER_SITE_OTHER): Payer: Medicaid Other | Admitting: Obstetrics and Gynecology

## 2016-06-10 VITALS — BP 121/80 | HR 90 | Wt 275.1 lb

## 2016-06-10 DIAGNOSIS — O99210 Obesity complicating pregnancy, unspecified trimester: Secondary | ICD-10-CM

## 2016-06-10 DIAGNOSIS — Z3685 Encounter for antenatal screening for Streptococcus B: Secondary | ICD-10-CM

## 2016-06-10 DIAGNOSIS — O0993 Supervision of high risk pregnancy, unspecified, third trimester: Secondary | ICD-10-CM | POA: Diagnosis not present

## 2016-06-10 DIAGNOSIS — O24415 Gestational diabetes mellitus in pregnancy, controlled by oral hypoglycemic drugs: Secondary | ICD-10-CM

## 2016-06-10 DIAGNOSIS — Z113 Encounter for screening for infections with a predominantly sexual mode of transmission: Secondary | ICD-10-CM

## 2016-06-10 LAB — OB RESULTS CONSOLE GBS: GBS: POSITIVE

## 2016-06-10 LAB — POCT URINALYSIS DIPSTICK
Bilirubin, UA: NEGATIVE
Glucose, UA: NEGATIVE
Ketones, UA: NEGATIVE
Nitrite, UA: NEGATIVE
PH UA: 6 (ref 5.0–8.0)
RBC UA: NEGATIVE
Spec Grav, UA: 1.025 (ref 1.030–1.035)
UROBILINOGEN UA: NEGATIVE (ref ?–2.0)

## 2016-06-11 NOTE — Progress Notes (Signed)
ROB: Patient doing better on Glyburide (less GI symptoms).  Fastings still 90-99, postprandials all less than 130.  Still notes a lot of pressure in pelvis. Will plan for next growth scan in 2 weeks. 36 week labs done today. NST performed today was reviewed and was found to be reactive.  Continue recommended antenatal testing and prenatal care.   RTC in 1 week. Discussed PTL precautions.    NONSTRESS TEST INTERPRETATION  INDICATIONS: Gestational DM (on oral hypoglycemic) and Obesity  FHR baseline: 155 RESULTS:Reactive COMMENTS: No contractions present.    PLAN: 1. Continue fetal kick counts twice a day. 2. Continue antepartum testing as scheduled-Biweekly

## 2016-06-12 LAB — GC/CHLAMYDIA PROBE AMP
CHLAMYDIA, DNA PROBE: NEGATIVE
Neisseria gonorrhoeae by PCR: NEGATIVE

## 2016-06-13 LAB — CULTURE, BETA STREP (GROUP B ONLY): Strep Gp B Culture: POSITIVE — AB

## 2016-06-16 ENCOUNTER — Encounter: Payer: Self-pay | Admitting: Dietician

## 2016-06-16 ENCOUNTER — Encounter: Payer: Medicaid Other | Attending: Obstetrics and Gynecology | Admitting: Dietician

## 2016-06-16 VITALS — BP 120/74 | Ht 68.0 in | Wt 274.4 lb

## 2016-06-16 DIAGNOSIS — Z713 Dietary counseling and surveillance: Secondary | ICD-10-CM | POA: Diagnosis not present

## 2016-06-16 DIAGNOSIS — O24419 Gestational diabetes mellitus in pregnancy, unspecified control: Secondary | ICD-10-CM | POA: Diagnosis not present

## 2016-06-16 DIAGNOSIS — O24415 Gestational diabetes mellitus in pregnancy, controlled by oral hypoglycemic drugs: Secondary | ICD-10-CM

## 2016-06-16 NOTE — Patient Instructions (Signed)
   Make sure to eat something every 3-5 hours during the day. Include a protein food with every meal or snack.   Use 1900-calorie plan while pregnant or breastfeeding; use 1500-calorie plan after breastfeeding for weight loss.   Consider keeping a food diary to track intake.   Keep up your walking, great job!

## 2016-06-16 NOTE — Progress Notes (Signed)
   Patient's BG recall indicates fasting BGs generally within goal ranges since beginning Glyburide; post-meal BGs range up to 130s.   Patient's food recall indicates generally balanced meals.    Provided 1900kcal meal plan, and wrote individualized menus based on patient's food preferences. Instructed on 1500kcal meal plan for weight loss after delivery or breastfeeding (if able to breastfeed). Advised avoiding calorie restriction while breastfeeding. Wrote individualized menus and provided additional menu options.   Instructed patient on food safety, including avoidance of Listeriosis, and limiting mercury from fish.  Discussed importance of maintaining healthy lifestyle habits to reduce risk of Type 2 DM as well as Gestational DM with any future pregnancies.  Advised patient to use any remaining testing supplies to test some BGs after delivery, and to have BG tested ideally annually, as well as prior to attempting future pregnancies.

## 2016-06-17 ENCOUNTER — Observation Stay
Admission: RE | Admit: 2016-06-17 | Discharge: 2016-06-17 | Disposition: A | Discharge status: Home / Self Care | Payer: Medicaid Other | Attending: Obstetrics and Gynecology | Admitting: Obstetrics and Gynecology

## 2016-06-17 ENCOUNTER — Ambulatory Visit (INDEPENDENT_AMBULATORY_CARE_PROVIDER_SITE_OTHER): Payer: Medicaid Other | Admitting: Obstetrics and Gynecology

## 2016-06-17 ENCOUNTER — Encounter: Payer: Self-pay | Admitting: *Deleted

## 2016-06-17 VITALS — BP 124/74 | HR 85 | Wt 273.0 lb

## 2016-06-17 DIAGNOSIS — Z3A37 37 weeks gestation of pregnancy: Secondary | ICD-10-CM | POA: Insufficient documentation

## 2016-06-17 DIAGNOSIS — O09523 Supervision of elderly multigravida, third trimester: Secondary | ICD-10-CM

## 2016-06-17 DIAGNOSIS — Z79899 Other long term (current) drug therapy: Secondary | ICD-10-CM | POA: Diagnosis not present

## 2016-06-17 DIAGNOSIS — O99820 Streptococcus B carrier state complicating pregnancy: Secondary | ICD-10-CM | POA: Insufficient documentation

## 2016-06-17 DIAGNOSIS — O24415 Gestational diabetes mellitus in pregnancy, controlled by oral hypoglycemic drugs: Principal | ICD-10-CM | POA: Insufficient documentation

## 2016-06-17 DIAGNOSIS — O24419 Gestational diabetes mellitus in pregnancy, unspecified control: Secondary | ICD-10-CM | POA: Diagnosis present

## 2016-06-17 LAB — POCT URINALYSIS DIPSTICK
BILIRUBIN UA: NEGATIVE
Blood, UA: NEGATIVE
Glucose, UA: NEGATIVE
KETONES UA: NEGATIVE
Nitrite, UA: NEGATIVE
PH UA: 6.5 (ref 5.0–8.0)
Protein, UA: NEGATIVE
Spec Grav, UA: 1.02 (ref 1.030–1.035)
Urobilinogen, UA: NEGATIVE (ref ?–2.0)

## 2016-06-17 NOTE — Progress Notes (Signed)
ROB: Notes more contractions.  Forgot glucose book today.  Notes blood sugars ranging same as last week.  Continue twice weekly NSTs at hospital. For growth scan next week. For IOL at 39 weeks.

## 2016-06-17 NOTE — Patient Instructions (Addendum)

## 2016-06-17 NOTE — OB Triage Note (Signed)
Here for NST

## 2016-06-17 NOTE — Final Progress Note (Signed)
L&D OB Triage Note  Marie Palmer is a 26 y.o. [id number] female at [redacted]w[redacted]d, EDD Estimated Date of Delivery: 07/05/16 who presented to triage for scheduled NST for GDM (on glyburide). Vital signs stable. An NST was performed and has been reviewed by MD.    NST INTERPRETATION: Indications: gestational diabetes mellitus  Mode: External Baseline Rate (A): 135 bpm Variability: Moderate Accelerations: 15 x 15 Decelerations: None Nonstress Test Interpretation: Reactive   Contraction Frequency (min): occasional  Impression: reactive   Plan: NST performed was reviewed and was found to be reactive. She was discharged home.   Continue routine prenatal care. Follow up with OB/GYN as previously scheduled.     Hildred Laser, MD  [facility]

## 2016-06-20 ENCOUNTER — Observation Stay (INDEPENDENT_AMBULATORY_CARE_PROVIDER_SITE_OTHER): Payer: Medicaid Other

## 2016-06-20 ENCOUNTER — Observation Stay
Admission: RE | Admit: 2016-06-20 | Discharge: 2016-06-20 | Disposition: A | Discharge status: Home / Self Care | Payer: Medicaid Other | Attending: Obstetrics and Gynecology | Admitting: Obstetrics and Gynecology

## 2016-06-20 ENCOUNTER — Other Ambulatory Visit: Payer: Medicaid Other

## 2016-06-20 DIAGNOSIS — O24415 Gestational diabetes mellitus in pregnancy, controlled by oral hypoglycemic drugs: Secondary | ICD-10-CM | POA: Diagnosis not present

## 2016-06-20 DIAGNOSIS — Z3A38 38 weeks gestation of pregnancy: Secondary | ICD-10-CM | POA: Insufficient documentation

## 2016-06-20 DIAGNOSIS — O24419 Gestational diabetes mellitus in pregnancy, unspecified control: Secondary | ICD-10-CM | POA: Diagnosis present

## 2016-06-20 NOTE — OB Triage Note (Signed)
Pt presents to have her scheduled NST. Denies any NVD, bleeding, or LOF. Reports positive fetal movement.

## 2016-06-22 NOTE — Discharge Summary (Signed)
L&D OB Triage Note  Marie Palmer is a 26 y.o. [id number] female at [redacted]w[redacted]d, EDD Estimated Date of Delivery: 07/05/16 who presented to triage for scheduled NST for GDM on Glyburide.  Vital signs stable. An NST was performed and has been reviewed by MD.   NST INTERPRETATION: Indications: gestational diabetes mellitus  Mode: External Baseline Rate (A): 135 bpm Variability: Moderate Accelerations: 15 x 15 Decelerations: None     Contraction Frequency (min): occasional  Impression: reactive   Plan: NST performed was reviewed and was found to be reactive. She was discharged home with bleeding/labor precautions.  Continue routine prenatal care. Follow up with OB/GYN as previously scheduled.     Hildred Laser, MD [facility]

## 2016-06-24 ENCOUNTER — Ambulatory Visit (INDEPENDENT_AMBULATORY_CARE_PROVIDER_SITE_OTHER): Payer: Medicaid Other | Admitting: Obstetrics and Gynecology

## 2016-06-24 ENCOUNTER — Observation Stay
Admission: RE | Admit: 2016-06-24 | Discharge: 2016-06-24 | Disposition: A | Discharge status: Home / Self Care | Payer: Medicaid Other | Attending: Obstetrics and Gynecology | Admitting: Obstetrics and Gynecology

## 2016-06-24 VITALS — BP 131/84 | HR 87 | Wt 273.6 lb

## 2016-06-24 DIAGNOSIS — O99210 Obesity complicating pregnancy, unspecified trimester: Secondary | ICD-10-CM

## 2016-06-24 DIAGNOSIS — O24415 Gestational diabetes mellitus in pregnancy, controlled by oral hypoglycemic drugs: Secondary | ICD-10-CM

## 2016-06-24 DIAGNOSIS — Z3A38 38 weeks gestation of pregnancy: Secondary | ICD-10-CM | POA: Diagnosis not present

## 2016-06-24 DIAGNOSIS — O24419 Gestational diabetes mellitus in pregnancy, unspecified control: Secondary | ICD-10-CM

## 2016-06-24 DIAGNOSIS — O3663X0 Maternal care for excessive fetal growth, third trimester, not applicable or unspecified: Secondary | ICD-10-CM

## 2016-06-24 DIAGNOSIS — O0993 Supervision of high risk pregnancy, unspecified, third trimester: Secondary | ICD-10-CM

## 2016-06-24 LAB — POCT URINALYSIS DIPSTICK
BILIRUBIN UA: NEGATIVE
Blood, UA: NEGATIVE
GLUCOSE UA: NEGATIVE
KETONES UA: NEGATIVE
Leukocytes, UA: NEGATIVE
Nitrite, UA: NEGATIVE
SPEC GRAV UA: 1.015 (ref 1.030–1.035)
UROBILINOGEN UA: 0.2 (ref ?–2.0)
pH, UA: 6.5 (ref 5.0–8.0)

## 2016-06-24 NOTE — Progress Notes (Signed)
ROB- pt went to [facility] for NST, she is doing well, having some pelvic pressure

## 2016-06-24 NOTE — OB Triage Note (Signed)
Pt [id number] [redacted]w[redacted]d presents for scheduled Non Stress Test. Pt states + FM,  States she has a little leaking of fluid. Monitors applied and assessing. VSS.
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
## 2016-06-24 NOTE — Progress Notes (Signed)
Nitrazine negative on pt.

## 2016-06-24 NOTE — Final Progress Note (Signed)
L&D OB Triage Note  Marie Palmer is a 26 y.o. [id number] female at [redacted]w[redacted]d, EDD Estimated Date of Delivery: 07/05/16 who presented to triage for scheduled NST for gestational diabetes (on Glyburide).   Vital signs stable. An NST was performed and has been reviewed by MD.    NST INTERPRETATION: Indications: gestational diabetes mellitus  Mode: External Baseline Rate (A): 160 bpm Variability: Moderate Accelerations: 15 x 15 Decelerations: None     Contraction Frequency (min): rare  Impression: reactive   Plan: NST performed was reviewed and was found to be reactive. She was discharged home with bleeding/labor precautions.  Continue routine prenatal care. Follow up with OB/GYN as previously scheduled.     Hildred Laser, MD [facility]

## 2016-06-24 NOTE — Patient Instructions (Addendum)
Labor Induction Labor induction is when steps are taken to cause a pregnant woman to begin the labor process. Most women go into labor on their own between 37 weeks and 42 weeks of the pregnancy. When this does not happen or when there is a medical need, methods may be used to induce labor. Labor induction causes a pregnant woman's uterus to contract. It also causes the cervix to soften (ripen), open (dilate), and thin out (efface). Usually, labor is not induced before 39 weeks of the pregnancy unless there is a problem with the baby or mother. Before inducing labor, your health care provider will consider a number of factors, including the following:  The medical condition of you and the baby.  How many weeks along you are.  The status of the baby's lung maturity.  The condition of the cervix.  The position of the baby. What are the reasons for labor induction? Labor may be induced for the following reasons:  The health of the baby or mother is at risk.  The pregnancy is overdue by 1 week or more.  The water breaks but labor does not start on its own.  The mother has a health condition or serious illness, such as high blood pressure, infection, placental abruption, or diabetes.  The amniotic fluid amounts are low around the baby.  The baby is distressed. Convenience or wanting the baby to be born on a certain date is not a reason for inducing labor. What methods are used for labor induction? Several methods of labor induction may be used, such as:  Prostaglandin medicine. This medicine causes the cervix to dilate and ripen. The medicine will also start contractions. It can be taken by mouth or by inserting a suppository into the vagina.  Inserting a thin tube (catheter) with a balloon on the end into the vagina to dilate the cervix. Once inserted, the balloon is expanded with water, which causes the cervix to open.  Stripping the membranes. Your health care provider separates  amniotic sac tissue from the cervix, causing the cervix to be stretched and causing the release of a hormone called progesterone. This may cause the uterus to contract. It is often done during an office visit. You will be sent home to wait for the contractions to begin. You will then come in for an induction.  Breaking the water. Your health care provider makes a hole in the amniotic sac using a small instrument. Once the amniotic sac breaks, contractions should begin. This may still take hours to see an effect.  Medicine to trigger or strengthen contractions. This medicine is given through an IV access tube inserted into a vein in your arm. All of the methods of induction, besides stripping the membranes, will be done in the hospital. Induction is done in the hospital so that you and the baby can be carefully monitored. How long does it take for labor to be induced? Some inductions can take up to 2-3 days. Depending on the cervix, it usually takes less time. It takes longer when you are induced early in the pregnancy or if this is your first pregnancy. If a mother is still pregnant and the induction has been going on for 2-3 days, either the mother will be sent home or a cesarean delivery will be needed. What are the risks associated with labor induction? Some of the risks of induction include:  Changes in fetal heart rate, such as too high, too low, or erratic.  Fetal distress.    Chance of infection for the mother and baby.  Increased chance of having a cesarean delivery.  Breaking off (abruption) of the placenta from the uterus (rare).  Uterine rupture (very rare). When induction is needed for medical reasons, the benefits of induction may outweigh the risks. What are some reasons for not inducing labor? Labor induction should not be done if:  It is shown that your baby does not tolerate labor.  You have had previous surgeries on your uterus, such as a myomectomy or the removal of  fibroids.  Your placenta lies very low in the uterus and blocks the opening of the cervix (placenta previa).  Your baby is not in a head-down position.  The umbilical cord drops down into the birth canal in front of the baby. This could cut off the baby's blood and oxygen supply.  You have had a previous cesarean delivery.  There are unusual circumstances, such as the baby being extremely premature. This information is not intended to replace advice given to you by your health care provider. Make sure you discuss any questions you have with your health care provider. Document Released: 07/23/2006 Document Revised: 08/09/2015 Document Reviewed: 09/30/2012 Elsevier Interactive Patient Education  2017 [employer].  .,

## 2016-06-25 DIAGNOSIS — O3663X0 Maternal care for excessive fetal growth, third trimester, not applicable or unspecified: Secondary | ICD-10-CM | POA: Insufficient documentation

## 2016-06-27 ENCOUNTER — Inpatient Hospital Stay
Admission: EM | Admit: 2016-06-27 | Discharge: 2016-06-29 | DRG: 775 | Disposition: A | Discharge status: Home / Self Care | Payer: Medicaid Other | Attending: Obstetrics and Gynecology | Admitting: Obstetrics and Gynecology

## 2016-06-27 ENCOUNTER — Encounter: Payer: Self-pay | Admitting: *Deleted

## 2016-06-27 ENCOUNTER — Other Ambulatory Visit: Payer: Medicaid Other

## 2016-06-27 DIAGNOSIS — O24425 Gestational diabetes mellitus in childbirth, controlled by oral hypoglycemic drugs: Secondary | ICD-10-CM | POA: Diagnosis present

## 2016-06-27 DIAGNOSIS — Z22330 Carrier of Group B streptococcus: Secondary | ICD-10-CM

## 2016-06-27 DIAGNOSIS — O3663X0 Maternal care for excessive fetal growth, third trimester, not applicable or unspecified: Secondary | ICD-10-CM | POA: Diagnosis present

## 2016-06-27 DIAGNOSIS — O99214 Obesity complicating childbirth: Secondary | ICD-10-CM | POA: Diagnosis present

## 2016-06-27 DIAGNOSIS — Z6841 Body Mass Index (BMI) 40.0 and over, adult: Secondary | ICD-10-CM | POA: Diagnosis not present

## 2016-06-27 DIAGNOSIS — O1494 Unspecified pre-eclampsia, complicating childbirth: Secondary | ICD-10-CM | POA: Diagnosis present

## 2016-06-27 DIAGNOSIS — Z3A39 39 weeks gestation of pregnancy: Secondary | ICD-10-CM | POA: Diagnosis not present

## 2016-06-27 DIAGNOSIS — O24415 Gestational diabetes mellitus in pregnancy, controlled by oral hypoglycemic drugs: Secondary | ICD-10-CM | POA: Diagnosis present

## 2016-06-27 LAB — RAPID HIV SCREEN (HIV 1/2 AB+AG)
HIV 1/2 ANTIBODIES: NONREACTIVE
HIV-1 P24 ANTIGEN - HIV24: NONREACTIVE

## 2016-06-27 LAB — CBC
HCT: 33.5 % — ABNORMAL LOW (ref 35.0–47.0)
Hemoglobin: 11.3 g/dL — ABNORMAL LOW (ref 12.0–16.0)
MCH: 27 pg (ref 26.0–34.0)
MCHC: 33.8 g/dL (ref 32.0–36.0)
MCV: 80 fL (ref 80.0–100.0)
PLATELETS: 323 10*3/uL (ref 150–440)
RBC: 4.19 MIL/uL (ref 3.80–5.20)
RDW: 16.1 % — AB (ref 11.5–14.5)
WBC: 12.8 10*3/uL — AB (ref 3.6–11.0)

## 2016-06-27 LAB — TYPE AND SCREEN
ABO/RH(D): A POS
ANTIBODY SCREEN: NEGATIVE

## 2016-06-27 MED ORDER — LIDOCAINE HCL (PF) 1 % IJ SOLN
INTRAMUSCULAR | Status: AC
  Filled 2016-06-27: qty 30

## 2016-06-27 MED ORDER — SODIUM CHLORIDE 0.9 % IV SOLN
2.0000 g | Freq: Once | INTRAVENOUS | Status: AC
  Administered 2016-06-27: 2 g via INTRAVENOUS
  Filled 2016-06-27: qty 2000

## 2016-06-27 MED ORDER — BUTORPHANOL TARTRATE 1 MG/ML IJ SOLN
1.0000 mg | INTRAMUSCULAR | Status: DC | PRN
  Administered 2016-06-28: 1 mg via INTRAVENOUS
  Filled 2016-06-27: qty 1

## 2016-06-27 MED ORDER — LIDOCAINE HCL (PF) 1 % IJ SOLN
30.0000 mL | INTRAMUSCULAR | Status: AC | PRN
Start: 2016-06-27 — End: 2016-06-28
  Administered 2016-06-28: 3 mL via SUBCUTANEOUS

## 2016-06-27 MED ORDER — LACTATED RINGERS IV SOLN
500.0000 mL | INTRAVENOUS | Status: DC | PRN
  Administered 2016-06-28: 500 mL via INTRAVENOUS

## 2016-06-27 MED ORDER — OXYCODONE-ACETAMINOPHEN 5-325 MG PO TABS
1.0000 | ORAL_TABLET | ORAL | Status: DC | PRN
Start: 2016-06-27 — End: 2016-06-28

## 2016-06-27 MED ORDER — ACETAMINOPHEN 325 MG PO TABS: 650.0000 mg | ORAL_TABLET | ORAL | Status: DC | PRN

## 2016-06-27 MED ORDER — SOD CITRATE-CITRIC ACID 500-334 MG/5ML PO SOLN
30.0000 mL | ORAL | Status: DC | PRN
  Filled 2016-06-27: qty 30

## 2016-06-27 MED ORDER — MISOPROSTOL 200 MCG PO TABS
ORAL_TABLET | ORAL | Status: AC
  Administered 2016-06-27: 25 ug via VAGINAL
  Filled 2016-06-27: qty 4

## 2016-06-27 MED ORDER — OXYTOCIN 10 UNIT/ML IJ SOLN
INTRAMUSCULAR | Status: AC
  Filled 2016-06-27: qty 2

## 2016-06-27 MED ORDER — ONDANSETRON HCL 4 MG/2ML IJ SOLN: 4.0000 mg | Freq: Four times a day (QID) | INTRAMUSCULAR | Status: DC | PRN

## 2016-06-27 MED ORDER — OXYCODONE-ACETAMINOPHEN 5-325 MG PO TABS: 2.0000 | ORAL_TABLET | ORAL | Status: DC | PRN

## 2016-06-27 MED ORDER — SODIUM CHLORIDE 0.9 % IV SOLN
1.0000 g | INTRAVENOUS | Status: DC
  Administered 2016-06-28 (×4): 1 g via INTRAVENOUS
  Filled 2016-06-27 (×9): qty 1000

## 2016-06-27 MED ORDER — OXYTOCIN BOLUS FROM INFUSION
500.0000 mL | Freq: Once | INTRAVENOUS | Status: AC
  Administered 2016-06-28: 500 mL via INTRAVENOUS

## 2016-06-27 MED ORDER — AMMONIA AROMATIC IN INHA
RESPIRATORY_TRACT | Status: DC
Start: 2016-06-27 — End: 2016-06-29
  Filled 2016-06-27: qty 10

## 2016-06-27 MED ORDER — MISOPROSTOL 25 MCG QUARTER TABLET
25.0000 ug | ORAL_TABLET | ORAL | Status: DC | PRN
  Administered 2016-06-27 – 2016-06-28 (×2): 25 ug via VAGINAL
  Filled 2016-06-27 (×2): qty 1

## 2016-06-27 MED ORDER — OXYTOCIN 40 UNITS IN LACTATED RINGERS INFUSION - SIMPLE MED
2.5000 [IU]/h | INTRAVENOUS | Status: DC
  Filled 2016-06-27: qty 1000

## 2016-06-27 MED ORDER — TERBUTALINE SULFATE 1 MG/ML IJ SOLN: 0.2500 mg | Freq: Once | INTRAMUSCULAR | Status: DC | PRN

## 2016-06-27 MED ORDER — LACTATED RINGERS IV SOLN
INTRAVENOUS | Status: DC
  Administered 2016-06-27: 20:00:00 via INTRAVENOUS

## 2016-06-27 NOTE — OB Triage Note (Signed)
Recvd pt from ED for scheduled induction.

## 2016-06-28 ENCOUNTER — Inpatient Hospital Stay: Payer: Medicaid Other | Admitting: Anesthesiology

## 2016-06-28 ENCOUNTER — Encounter: Payer: Self-pay | Admitting: Anesthesiology

## 2016-06-28 DIAGNOSIS — O24425 Gestational diabetes mellitus in childbirth, controlled by oral hypoglycemic drugs: Secondary | ICD-10-CM

## 2016-06-28 DIAGNOSIS — Z3A39 39 weeks gestation of pregnancy: Secondary | ICD-10-CM

## 2016-06-28 LAB — GLUCOSE, CAPILLARY
GLUCOSE-CAPILLARY: 95 mg/dL (ref 65–99)
GLUCOSE-CAPILLARY: 203 mg/dL — AB (ref 65–99)
Glucose-Capillary: 71 mg/dL (ref 65–99)

## 2016-06-28 MED ORDER — COCONUT OIL OIL
1.0000 "application " | TOPICAL_OIL | Status: DC | PRN
  Administered 2016-06-29: 1 via TOPICAL
  Filled 2016-06-28: qty 120

## 2016-06-28 MED ORDER — GLYBURIDE 2.5 MG PO TABS
2.5000 mg | ORAL_TABLET | Freq: Two times a day (BID) | ORAL | Status: DC
  Administered 2016-06-28 – 2016-06-29 (×2): 2.5 mg via ORAL
  Filled 2016-06-28 (×3): qty 1

## 2016-06-28 MED ORDER — FENTANYL 2.5 MCG/ML W/ROPIVACAINE 0.2% IN NS 100 ML EPIDURAL INFUSION (ARMC-ANES)
EPIDURAL | Status: DC | PRN
  Administered 2016-06-28: 10 mL/h via EPIDURAL

## 2016-06-28 MED ORDER — NALBUPHINE HCL 10 MG/ML IJ SOLN: 5.0000 mg | Freq: Once | INTRAMUSCULAR | Status: DC | PRN

## 2016-06-28 MED ORDER — ACETAMINOPHEN 325 MG PO TABS: 650.0000 mg | ORAL_TABLET | ORAL | Status: DC | PRN

## 2016-06-28 MED ORDER — PRENATAL MULTIVITAMIN CH
1.0000 | ORAL_TABLET | Freq: Every day | ORAL | Status: DC
  Administered 2016-06-29: 1 via ORAL
  Filled 2016-06-28: qty 1

## 2016-06-28 MED ORDER — FENTANYL 2.5 MCG/ML W/ROPIVACAINE 0.2% IN NS 100 ML EPIDURAL INFUSION (ARMC-ANES)
EPIDURAL | Status: AC
  Filled 2016-06-28: qty 100

## 2016-06-28 MED ORDER — TETANUS-DIPHTH-ACELL PERTUSSIS 5-2.5-18.5 LF-MCG/0.5 IM SUSP: 0.5000 mL | Freq: Once | INTRAMUSCULAR | Status: DC

## 2016-06-28 MED ORDER — OXYTOCIN 40 UNITS IN LACTATED RINGERS INFUSION - SIMPLE MED
2.5000 [IU]/h | INTRAVENOUS | Status: DC | PRN
  Filled 2016-06-28: qty 1000

## 2016-06-28 MED ORDER — KETOROLAC TROMETHAMINE 30 MG/ML IJ SOLN: 30.0000 mg | Freq: Four times a day (QID) | INTRAMUSCULAR | Status: DC | PRN

## 2016-06-28 MED ORDER — NALBUPHINE HCL 10 MG/ML IJ SOLN: 5.0000 mg | INTRAMUSCULAR | Status: DC | PRN

## 2016-06-28 MED ORDER — IBUPROFEN 600 MG PO TABS
600.0000 mg | ORAL_TABLET | Freq: Four times a day (QID) | ORAL | Status: DC
  Administered 2016-06-28 – 2016-06-29 (×4): 600 mg via ORAL
  Filled 2016-06-28 (×4): qty 1

## 2016-06-28 MED ORDER — NALOXONE HCL 2 MG/2ML IJ SOSY
1.0000 ug/kg/h | PREFILLED_SYRINGE | INTRAVENOUS | Status: DC | PRN
  Filled 2016-06-28: qty 2

## 2016-06-28 MED ORDER — ZOLPIDEM TARTRATE 5 MG PO TABS: 5.0000 mg | ORAL_TABLET | Freq: Every evening | ORAL | Status: DC | PRN

## 2016-06-28 MED ORDER — DIPHENHYDRAMINE HCL 25 MG PO CAPS: 25.0000 mg | ORAL_CAPSULE | ORAL | Status: DC | PRN

## 2016-06-28 MED ORDER — ONDANSETRON HCL 4 MG PO TABS: 4.0000 mg | ORAL_TABLET | ORAL | Status: DC | PRN

## 2016-06-28 MED ORDER — ONDANSETRON HCL 4 MG/2ML IJ SOLN: 4.0000 mg | INTRAMUSCULAR | Status: DC | PRN

## 2016-06-28 MED ORDER — FENTANYL 2.5 MCG/ML W/ROPIVACAINE 0.2% IN NS 100 ML EPIDURAL INFUSION (ARMC-ANES): 10.0000 mL/h | EPIDURAL | Status: DC

## 2016-06-28 MED ORDER — ROPIVACAINE HCL 2 MG/ML IJ SOLN
10.0000 mL/h | INTRAMUSCULAR | Status: DC
  Filled 2016-06-28: qty 5

## 2016-06-28 MED ORDER — OXYCODONE-ACETAMINOPHEN 5-325 MG PO TABS
1.0000 | ORAL_TABLET | ORAL | Status: DC | PRN
  Administered 2016-06-29 (×4): 1 via ORAL
  Filled 2016-06-28 (×4): qty 1

## 2016-06-28 MED ORDER — DIBUCAINE 1 % RE OINT: 1.0000 "application " | TOPICAL_OINTMENT | RECTAL | Status: DC | PRN

## 2016-06-28 MED ORDER — BENZOCAINE-MENTHOL 20-0.5 % EX AERO
1.0000 "application " | INHALATION_SPRAY | CUTANEOUS | Status: DC | PRN
  Administered 2016-06-29: 1 via TOPICAL
  Filled 2016-06-28: qty 56

## 2016-06-28 MED ORDER — DIPHENHYDRAMINE HCL 50 MG/ML IJ SOLN: 12.5000 mg | INTRAMUSCULAR | Status: DC | PRN

## 2016-06-28 MED ORDER — WITCH HAZEL-GLYCERIN EX PADS: 1.0000 "application " | MEDICATED_PAD | CUTANEOUS | Status: DC | PRN

## 2016-06-28 MED ORDER — DIPHENHYDRAMINE HCL 25 MG PO CAPS: 25.0000 mg | ORAL_CAPSULE | Freq: Four times a day (QID) | ORAL | Status: DC | PRN

## 2016-06-28 MED ORDER — OXYTOCIN 40 UNITS IN LACTATED RINGERS INFUSION - SIMPLE MED
1.0000 m[IU]/min | INTRAVENOUS | Status: DC
Start: 2016-06-28 — End: 2016-06-28
  Administered 2016-06-28: 2 m[IU]/min via INTRAVENOUS

## 2016-06-28 MED ORDER — MEPERIDINE HCL 25 MG/ML IJ SOLN: 6.2500 mg | INTRAMUSCULAR | Status: DC | PRN

## 2016-06-28 MED ORDER — SIMETHICONE 80 MG PO CHEW: 80.0000 mg | CHEWABLE_TABLET | ORAL | Status: DC | PRN

## 2016-06-28 MED ORDER — NALOXONE HCL 0.4 MG/ML IJ SOLN: 0.4000 mg | INTRAMUSCULAR | Status: DC | PRN

## 2016-06-28 MED ORDER — OXYCODONE-ACETAMINOPHEN 5-325 MG PO TABS: 2.0000 | ORAL_TABLET | ORAL | Status: DC | PRN

## 2016-06-28 MED ORDER — SODIUM CHLORIDE FLUSH 0.9 % IV SOLN
INTRAVENOUS | Status: AC
  Filled 2016-06-28: qty 40

## 2016-06-28 MED ORDER — LIDOCAINE-EPINEPHRINE (PF) 1.5 %-1:200000 IJ SOLN
INTRAMUSCULAR | Status: DC | PRN
  Administered 2016-06-28: 3 mL via EPIDURAL

## 2016-06-28 MED ORDER — DOCUSATE SODIUM 100 MG PO CAPS
100.0000 mg | ORAL_CAPSULE | Freq: Two times a day (BID) | ORAL | Status: DC
  Administered 2016-06-28 – 2016-06-29 (×2): 100 mg via ORAL
  Filled 2016-06-28 (×2): qty 1

## 2016-06-28 MED ORDER — SODIUM CHLORIDE 0.9% FLUSH: 3.0000 mL | INTRAVENOUS | Status: DC | PRN

## 2016-06-28 MED ORDER — ONDANSETRON HCL 4 MG/2ML IJ SOLN: 4.0000 mg | Freq: Three times a day (TID) | INTRAMUSCULAR | Status: DC | PRN

## 2016-06-28 MED ORDER — SODIUM CHLORIDE 0.9 % IV SOLN
INTRAVENOUS | Status: DC | PRN
  Administered 2016-06-28 (×2): 5 mL via EPIDURAL

## 2016-06-28 NOTE — Progress Notes (Signed)
LABOR NOTE   Marie Palmer 25 y.o.GP@ at [redacted]w[redacted]d Early latent labor from overnight Cytotec.  SUBJECTIVE:  Feels some contractions, but not uncomfortable. (8AM) OBJECTIVE:  BP 120/71 (BP Location: Left Arm)   Pulse 78   Temp 98.3 F (36.8 C) (Oral)   Resp (!) 24   Ht  (1.727 m)   Wt 273 lb (123.8 kg)   LMP 09/29/2015   BMI 41.51 kg/m  Total I/O In: 3 [I.V.:3] Out: -    CERVIX: 3 cm:50%: -3: mid position: mod  SVE:   Dilation: 3 Effacement (%): 50 Station: -3 Exam by:: evans CONTRACTIONS: irregular, every 5 minutes FHR: Fetal heart tracing reviewed. Accelerations: Reactive Category I   Labs: Lab Results  Component Value Date   WBC 12.8 (H) 06/27/2016   HGB 11.3 (L) 06/27/2016   HCT 33.5 (L) 06/27/2016   MCV 80.0 06/27/2016   PLT 323 06/27/2016    ASSESSMENT: 1) Labor curve reviewed.       Progress: Early latent labor.     Membranes: AROM - clear fluid.       Active Problems:   Gestational diabetes mellitus (GDM) controlled on oral hypoglycemic drug   PLAN: IV Pitocin augmentation Epidural as needed.  [name], M.D. 06/28/2016 10:36 AM

## 2016-06-28 NOTE — Anesthesia Procedure Notes (Signed)
Epidural Patient location during procedure: OB Start time: 06/28/2016 12:52 PM End time: 06/28/2016 1:00 PM  Staffing Anesthesiologist: Lenard Simmer Performed: anesthesiologist   Preanesthetic Checklist Completed: patient identified, site marked, surgical consent, pre-op evaluation, timeout performed, IV checked, risks and benefits discussed and monitors and equipment checked  Epidural Patient position: sitting Prep: ChloraPrep Patient monitoring: heart rate, continuous pulse ox and blood pressure Approach: midline Location: L4-L5 Injection technique: LOR saline  Needle:  Needle type: Tuohy  Needle gauge: 17 G Needle length: 9 cm and 9 Needle insertion depth: 6 cm Catheter type: closed end flexible Catheter size: 19 Gauge Catheter at skin depth: 11 cm Test dose: negative and 1.5% lidocaine with Epi 1:200 K  Assessment Sensory level: T10 Events: blood not aspirated, injection not painful, no injection resistance, negative IV test and no paresthesia  Additional Notes Pt. Evaluated and documentation done after procedure finished. Patient identified. Risks/Benefits/Options discussed with patient including but not limited to bleeding, infection, nerve damage, paralysis, failed block, incomplete pain control, headache, blood pressure changes, nausea, vomiting, reactions to medication both or allergic, itching and postpartum back pain. Confirmed with bedside nurse the patient's most recent platelet count. Confirmed with patient that they are not currently taking any anticoagulation, have any bleeding history or any family history of bleeding disorders. Patient expressed understanding and wished to proceed. All questions were answered. Sterile technique was used throughout the entire procedure. Please see nursing notes for vital signs. Test dose was given through epidural catheter and negative prior to continuing to dose epidural or start infusion. Warning signs of high block given to the  patient including shortness of breath, tingling/numbness in hands, complete motor block, or any concerning symptoms with instructions to call for help. Patient was given instructions on fall risk and not to get out of bed. All questions and concerns addressed with instructions to call with any issues or inadequate analgesia.   Patient tolerated the insertion well without immediate complications.Reason for block:procedure for pain

## 2016-06-28 NOTE — Anesthesia Preprocedure Evaluation (Addendum)
Anesthesia Evaluation  Patient identified by MRN, date of birth, ID band Patient awake    Reviewed: Allergy & Precautions, H&P , NPO status , Patient's Chart, lab work & pertinent test results, reviewed documented beta blocker date and time   History of Anesthesia Complications Negative for: history of anesthetic complications  Airway Mallampati: III  TM Distance: >3 FB Neck ROM: full    Dental  (+) Teeth Intact   Pulmonary neg shortness of breath, neg recent URI, former smoker,           Cardiovascular Exercise Tolerance: Good negative cardio ROS       Neuro/Psych negative neurological ROS  negative psych ROS   GI/Hepatic negative GI ROS, Neg liver ROS,   Endo/Other  diabetes, GestationalMorbid obesity  Renal/GU negative Renal ROS  negative genitourinary   Musculoskeletal   Abdominal   Peds  Hematology negative hematology ROS (+)   Anesthesia Other Findings Past Medical History: No date: History of gestational diabetes No date: History of pre-eclampsia No date: Migraines   Reproductive/Obstetrics (+) Pregnancy                            Anesthesia Physical Anesthesia Plan  ASA: III  Anesthesia Plan: Epidural   Post-op Pain Management:    Induction:   Airway Management Planned:   Additional Equipment:   Intra-op Plan:   Post-operative Plan:   Informed Consent: I have reviewed the patients History and Physical, chart, labs and discussed the procedure including the risks, benefits and alternatives for the proposed anesthesia with the patient or authorized representative who has indicated his/her understanding and acceptance.   Dental Advisory Given  Plan Discussed with: Anesthesiologist, CRNA and Surgeon  Anesthesia Plan Comments:         Anesthesia Quick Evaluation

## 2016-06-29 ENCOUNTER — Encounter: Payer: Self-pay | Admitting: Obstetrics and Gynecology

## 2016-06-29 LAB — GLUCOSE, CAPILLARY: Glucose-Capillary: 86 mg/dL (ref 65–99)

## 2016-06-29 LAB — RPR: RPR Ser Ql: NONREACTIVE

## 2016-06-29 NOTE — Progress Notes (Signed)
Discharge instructions given. Patient verbalizes understanding of teaching. Patient refused wheelchair for discharge. Patient carrier infant in carseat for discharge. Patient discharged home at 1525.

## 2016-06-29 NOTE — Plan of Care (Signed)
Problem: Food- and Nutrition-Related Knowledge Deficit (NB-1.1) Goal: Nutrition education Formal process to instruct or train a patient/client in a skill or to impart knowledge to help patients/clients voluntarily manage or modify food choices and eating behavior to maintain or improve health. Outcome: Completed/Met Date Met: 06/29/16  RD consulted for nutrition education regarding diabetes.   Lab Results  Component Value Date   HGBA1C 5.1 12/26/2015    RD provided "Carbohydrate Counting for People with Diabetes" handout from the Academy of Nutrition and Dietetics. Discussed different food groups and their effects on blood sugar, emphasizing carbohydrate-containing foods. Provided list of carbohydrates and recommended serving sizes of common foods.  Discussed importance of controlled and consistent carbohydrate intake throughout the day. Provided examples of ways to balance meals/snacks and encouraged intake of high-fiber, whole grain complex carbohydrates. Teach back method used.  Expect fair compliance.  Body mass index is 41.51 kg/m. Pt meets criteria for obese class III based on current BMI.  Current diet order is carb mod, patient is consuming approximately 100% of meals at this time. Labs and medications reviewed. No further nutrition interventions warranted at this time. RD contact information provided. If additional nutrition issues arise, please re-consult RD.  Satira Anis. Elric Tirado, MS, RD LDN Inpatient Clinical Dietitian Pager [phone]

## 2016-06-29 NOTE — Discharge Instructions (Signed)
Please call your doctor or return to the ER if you experience any chest pains, shortness of breath, dizziness, visual changes, fever greater than 101, any heavy bleeding (saturating more than 1 pad per hour), large clots, or foul smelling discharge, any worsening abdominal pain and cramping that is not controlled by pain medication, or any signs of postpartum depression. No tampons, enemas, douches, or sexual intercourse for 6 weeks. Also avoid tub baths, hot tubs, or swimming for 6 weeks.  ° ° ° ° ° °Home Care Instructions for Mom °ACTIVITY °· Gradually return to your regular activities. °· Let yourself rest. Nap while your baby sleeps. °· Avoid lifting anything that is heavier than 10 lb (4.5 kg) until your health care provider says it is okay. °· Avoid activities that take a lot of effort and energy (are strenuous) until approved by your health care provider. Walking at a slow-to-moderate pace is usually safe. °· If you had a cesarean delivery: °? Do not vacuum, climb stairs, or drive a car for 4-6 weeks. °? Have someone help you at home until you feel like you can do your usual activities yourself. °? Do exercises as told by your health care provider, if this applies. ° °VAGINAL BLEEDING °You may continue to bleed for 4-6 weeks after delivery. Over time, the amount of blood usually decreases and the color of the blood usually gets lighter. However, the flow of bright red blood may increase if you have been too active. If you need to use more than one pad in an hour because your pad gets soaked, or if you pass a large clot: °· Lie down. °· Raise your feet. °· Place a cold compress on your lower abdomen. °· Rest. °· Call your health care provider. ° °If you are breastfeeding, your period should return anytime between 8 weeks after delivery and the time that you stop breastfeeding. If you are not breastfeeding, your period should return 6-8 weeks after delivery. °PERINEAL CARE °The perineal area, or perineum, is  the part of your body between your thighs. After delivery, this area needs special care. Follow these instructions as told by your health care provider. °· Take warm tub baths for 15-20 minutes. °· Use medicated pads and pain-relieving sprays and creams as told. °· Do not use tampons or douches until vaginal bleeding has stopped. °· Each time you go to the bathroom: °? Use a peri bottle. °? Change your pad. °? Use towelettes in place of toilet paper until your stitches have healed. °· Do Kegel exercises every day. Kegel exercises help to maintain the muscles that support the vagina, bladder, and bowels. You can do these exercises while you are standing, sitting, or lying down. To do Kegel exercises: °? Tighten the muscles of your abdomen and the muscles that surround your birth canal. °? Hold for a few seconds. °? Relax. °? Repeat until you have done this 5 times in a row. °· To prevent hemorrhoids from developing or getting worse: °? Drink enough fluid to keep your urine clear or pale yellow. °? Avoid straining when having a bowel movement. °? Take over-the-counter medicines and stool softeners as told by your health care provider. ° °BREAST CARE °· Wear a tight-fitting bra. °· Avoid taking over-the-counter pain medicine for breast discomfort. °· Apply ice to the breasts to help with discomfort as needed: °? Put ice in a plastic bag. °? Place a towel between your skin and the bag. °? Leave the ice on for 20   minutes or as told by your health care provider. ° °NUTRITION °· Eat a well-balanced diet. °· Do not try to lose weight quickly by cutting back on calories. °· Take your prenatal vitamins until your postpartum checkup or until your health care provider tells you to stop. ° °POSTPARTUM DEPRESSION °You may find yourself crying for no apparent reason and unable to cope with all of the changes that come with having a newborn. This mood is called postpartum depression. Postpartum depression happens because your  hormone levels change after delivery. If you have postpartum depression, get support from your partner, friends, and family. If the depression does not go away on its own after several weeks, contact your health care provider. °BREAST SELF-EXAM °Do a breast self-exam each month, at the same time of the month. If you are breastfeeding, check your breasts just after a feeding, when your breasts are less full. If you are breastfeeding and your period has started, check your breasts on day 5, 6, or 7 of your period. °Report any lumps, bumps, or discharge to your health care provider. Know that breasts are normally lumpy if you are breastfeeding. This is temporary, and it is not a health risk. °INTIMACY AND SEXUALITY °Avoid sexual activity for at least 3-4 weeks after delivery or until the brownish-red vaginal flow is completely gone. If you want to avoid pregnancy, use some form of birth control. You can get pregnant after delivery, even if you have not had your period. °SEEK MEDICAL CARE IF: °· You feel unable to cope with the changes that a child brings to your life, and these feelings do not go away after several weeks. °· You notice a lump, a bump, or discharge on your breast. ° °SEEK IMMEDIATE MEDICAL CARE IF: °· Blood soaks your pad in 1 hour or less. °· You have: °? Severe pain or cramping in your lower abdomen. °? A bad-smelling vaginal discharge. °? A fever that is not controlled by medicine. °? A fever, and an area of your breast is red and sore. °? Pain or redness in your calf. °? Sudden, severe chest pain. °? Shortness of breath. °? Painful or bloody urination. °? Problems with your vision. °· You vomit for 12 hours or longer. °· You develop a severe headache. °· You have serious thoughts about hurting yourself, your child, or anyone else. ° °This information is not intended to replace advice given to you by your health care provider. Make sure you discuss any questions you have with your health care  provider. °Document Released: 02/29/2000 Document Revised: 08/09/2015 Document Reviewed: 09/04/2014 °Elsevier Interactive Patient Education © 2017 Elsevier Inc. ° °

## 2016-06-29 NOTE — Discharge Summary (Signed)
                             Discharge Summary  Date of Admission: 06/27/2016  Date of Discharge:   Admitting Diagnosis: Induction of labor at [redacted]w[redacted]d  Secondary Diagnosis: Gestational diabetes medication controlled (A2)  Mode of Delivery: Vaginal Delivery       Discharge Diagnosis: Same   Intrapartum Procedures: Atificial rupture of membranes, pitocin augmentation and Cytotec   Post partum procedures: none  Complications: none                      Discharge Day SOAP Note:  Progress Note - Vaginal Delivery  Marie Palmer is a 26 y.o. [id number] now PP day 1 s/p Vaginal, Spontaneous Delivery . Delivery was complicated by gestational diabetes requiring her induction.    Subjective  The patient has the following complaints: has no unusual complaints  Pain is controlled with current medications.   Patient is urinating without difficulty.  She is ambulating well.    Objective  Vital signs: BP 127/78 (BP Location: Left Arm)   Pulse 68   Temp 97.7 F (36.5 C) (Oral)   Resp 18   Ht  (1.727 m)   Wt 273 lb (123.8 kg)   LMP 09/29/2015   SpO2 100%   Breastfeeding? Unknown   BMI 41.51 kg/m   Physical Exam: Gen: NAD Fundus Fundal Tone: Firm  Lochia Amount: Small  Perineum Appearance: Intact, Edematous     Data Review Labs: CBC Latest Ref Rng & Units 06/27/2016 04/11/2016 12/26/2015  WBC 3.6 - 11.0 K/uL 12.8(H) - 10.9(H)  Hemoglobin 12.0 - 16.0 g/dL 11.3(L) - -  Hematocrit 35.0 - 47.0 % 33.5(L) 33.8(L) 36.5  Platelets 150 - 440 K/uL 323 - 258   A POS  Assessment/Plan  Active Problems:   Gestational diabetes mellitus (GDM) controlled on oral hypoglycemic drug    Plan for discharge today.   Discharge Instructions: Per After Visit Summary. Activity: Advance as tolerated. Pelvic rest for 6 weeks.  Also refer to After Visit Summary Diet: Regular Medications: Allergies as of 06/29/2016   No Known Allergies     Medication List    TAKE these medications    ACCU-CHEK FASTCLIX LANCETS Misc 1 Units by Percutaneous route 4 (four) times daily.   CITRANATAL HARMONY 27-1-260 MG Caps Take 27 mg by mouth daily.   glucose blood test strip Commonly known as:  ACCU-CHEK GUIDE Use four times daily as directed.   glyBURIDE 2.5 MG tablet Commonly known as:  DIABETA Take 1 tablet (2.5 mg total) by mouth 2 (two) times daily with a meal.      Outpatient follow up:  Follow-up Information    [facility] Follow up.   Contact information: [facility] [address] [place] [address] [phone]        Brennan Bailey, MD Follow up in 6 week(s).   Specialty:  Obstetrics and Gynecology Contact information: [address] [place] [address] [phone]          Postpartum contraception: IUD  Discharged Condition: good  Discharged to: home  Newborn Data: Disposition:home with mother  Apgars: APGAR (1 MIN): 8   APGAR (5 MINS): 8   APGAR (10 MINS):    Baby Feeding: Breast    [name], M.D. 06/29/2016 9:28 AM

## 2016-06-29 NOTE — Progress Notes (Signed)
Correction: Patient discharged home at 1725.
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
## 2016-06-29 NOTE — Anesthesia Postprocedure Evaluation (Signed)
Anesthesia Post Note  Patient: Marie Palmer  Procedure(s) Performed: * No procedures listed *  Patient location during evaluation: Mother Baby Anesthesia Type: Epidural Level of consciousness: awake and alert and oriented Pain management: pain level controlled Vital Signs Assessment: post-procedure vital signs reviewed and stable Respiratory status: spontaneous breathing, nonlabored ventilation and respiratory function stable Cardiovascular status: stable Postop Assessment: no headache, no backache, epidural receding and no signs of nausea or vomiting (no pruritis) Anesthetic complications: no     Last Vitals:  Vitals:   06/29/16 0747 06/29/16 1201  BP: 127/78 128/74  Pulse: 68 72  Resp: 18 20  Temp: 36.5 C 36.9 C    Last Pain:  Vitals:   06/29/16 1229  TempSrc:   PainSc: 7                  Kayle Correa

## 2016-07-04 NOTE — H&P (Signed)
 @  History and Physical   HPI  Marie Palmer is a 26 y.o. [id number] at [redacted]w[redacted]d Estimated Date of Delivery: 07/05/16 who is being admitted for induction of labor for Gestational DM on medication.  OB History  Obstetric History   G3   P3   T2   P1   A0   L3    SAB0   TAB0   Ectopic0   Multiple0   Live Births3     # Outcome Date GA Lbr Len/2nd Weight Sex Delivery Anes PTL Lv  3 Term 06/28/16 [redacted]w[redacted]d / 00:18 8 lb 4.6 oz (3.76 kg) F Vag-Spont EPI  LIV     Name: Oneal Deputy     Apgar1:  8                Apgar5: 8  2 Term 2014 [redacted]w[redacted]d  6 lb 13 oz (3.09 kg) F Vag-Spont EPI  LIV  1 Preterm 2010 [redacted]w[redacted]d  6 lb 12 oz (3.062 kg) M Vag-Spont EPI  LIV     Complications: Gestational diabetes,Pre-eclampsia    Obstetric Comments  G1- Induction for pre-eclampsia.  Also had GDM.     PROBLEM LIST  Pregnancy complications or risks: Patient Active Problem List   Diagnosis Date Noted  . Gestational diabetes mellitus (GDM) controlled on oral hypoglycemic drug 06/27/2016  . Excessive fetal growth affecting management of mother in third trimester, antepartum 06/25/2016  . Labor and delivery, indication for care 06/24/2016  . Indication for care in labor or delivery 06/17/2016  . Group B Streptococcus carrier, +RV culture, currently pregnant 06/17/2016  . Supervision of high risk elderly multigravida in third trimester 06/17/2016  . Gestational diabetes mellitus (GDM) in third trimester 04/24/2016  . Sleep disturbances 02/20/2016  . Chlamydia infection affecting pregnancy in first trimester 01/27/2016  . Obesity in pregnancy 12/28/2015  . H/O gestational diabetes in prior pregnancy, currently pregnant 12/28/2015  . H/O pre-eclampsia in prior pregnancy, currently pregnant 12/28/2015  . Tobacco use disorder, mild, in sustained remission 12/28/2015    Prenatal labs and studies: ABO, Rh: --/--/A POS (04/13 2016) Antibody: NEG (04/13 2016) Rubella: 1.17 (10/11 1100) RPR: Non Reactive (04/13 2018)   HBsAg: Negative (10/11 1100)  HIV: Non Reactive (10/11 1100)  AVW:[id number] (03/27 1700)   Past Medical History:  Diagnosis Date  . History of gestational diabetes   . History of pre-eclampsia   . Migraines      Past Surgical History:  Procedure Laterality Date  . CHOLECYSTECTOMY       Medications    Discharge Medication List as of 06/29/2016  4:11 PM    CONTINUE these medications which have NOT CHANGED   Details  ACCU-CHEK FASTCLIX LANCETS MISC 1 Units by Percutaneous route 4 (four) times daily., Starting Fri 05/09/2016, Normal    glucose blood (ACCU-CHEK GUIDE) test strip Use four times daily as directed., Normal    glyBURIDE (DIABETA) 2.5 MG tablet Take 1 tablet (2.5 mg total) by mouth 2 (two) times daily with a meal., Starting Tue 05/27/2016, Normal    Prenat-FeFmCb-DSS-FA-DHA w/o A (CITRANATAL HARMONY) 27-1-260 MG CAPS Take 27 mg by mouth daily., Starting Thu 12/27/2015, Normal         Allergies  Patient has no known allergies.  Review of Systems  Pertinent items noted in HPI and remainder of comprehensive ROS otherwise negative.  Physical Exam  BP 135/80 (BP Location: Left Arm)   Pulse 76   Temp 97.8 F (36.6 C) (Oral)   Resp  18   Ht  (1.727 m)   Wt 273 lb (123.8 kg)   LMP 09/29/2015   SpO2 100%   Breastfeeding? Unknown   BMI 41.51 kg/m   Lungs:  CTA B Cardio: RRR without M/R/G Abd: Soft, gravid, NT Presentation: cephalic EXT: No C/C/ 1+ Edema DTRs: 2+ B CERVIX: 2 cm:50%: -3: mid position See Prenatal records for more detailed PE.     FHR:  Variability: Good {> 6 bpm) and Accelerations: Reactive  Toco: Uterine Contractions: None   Test Results  No results found for this or any previous visit (from the past 24 hour(s)).   Assessment   [id number] at [redacted]w[redacted]d Estimated Date of Delivery: 07/05/16  The fetus is reassuring.   Patient Active Problem List   Diagnosis Date Noted  . Gestational diabetes mellitus (GDM) controlled on  oral hypoglycemic drug 06/27/2016  . Excessive fetal growth affecting management of mother in third trimester, antepartum 06/25/2016  . Labor and delivery, indication for care 06/24/2016  . Indication for care in labor or delivery 06/17/2016  . Group B Streptococcus carrier, +RV culture, currently pregnant 06/17/2016  . Supervision of high risk elderly multigravida in third trimester 06/17/2016  . Gestational diabetes mellitus (GDM) in third trimester 04/24/2016  . Sleep disturbances 02/20/2016  . Chlamydia infection affecting pregnancy in first trimester 01/27/2016  . Obesity in pregnancy 12/28/2015  . H/O gestational diabetes in prior pregnancy, currently pregnant 12/28/2015  . H/O pre-eclampsia in prior pregnancy, currently pregnant 12/28/2015  . Tobacco use disorder, mild, in sustained remission 12/28/2015    Plan  1. Admit to L&D for induction 2. EFM: -- Category 1 3. Epidural if desired. Stadol for IV pain until epidural requested. 4. Admission labs  5. Cytotec through night.  Pit in AM as needed.  [name], M.D. 07/04/2016 9:27 AM

## 2016-08-13 ENCOUNTER — Ambulatory Visit (INDEPENDENT_AMBULATORY_CARE_PROVIDER_SITE_OTHER): Payer: Medicaid Other | Admitting: Obstetrics and Gynecology

## 2016-08-13 ENCOUNTER — Encounter: Payer: Self-pay | Admitting: Obstetrics and Gynecology

## 2016-08-13 NOTE — Progress Notes (Signed)
HPI:      [name] is a 26 y.o. [phone] who LMP was No LMP recorded.  Subjective:   She presents today 6 weeks postpartum. She is doing well. She breast-fed for 1 month but is now bottle feeding. She has not had a menstrual period yet. She desires IUD for birth control.    Hx: The following portions of the patient's history were reviewed and updated as appropriate:             She  has a past medical history of History of gestational diabetes; History of pre-eclampsia; and Migraines. She  does not have any pertinent problems on file. She  has a past surgical history that includes Cholecystectomy. Her family history is not on file. She  reports that she quit smoking about 9 months ago. She smoked 0.25 packs per day. She has never used smokeless tobacco. She reports that she does not drink alcohol or use drugs. She has No Known Allergies.       Review of Systems:  Review of Systems  Constitutional: Denied constitutional symptoms, night sweats, recent illness, fatigue, fever, insomnia and weight loss.  Eyes: Denied eye symptoms, eye pain, photophobia, vision change and visual disturbance.  Ears/Nose/Throat/Neck: Denied ear, nose, throat or neck symptoms, hearing loss, nasal discharge, sinus congestion and sore throat.  Cardiovascular: Denied cardiovascular symptoms, arrhythmia, chest pain/pressure, edema, exercise intolerance, orthopnea and palpitations.  Respiratory: Denied pulmonary symptoms, asthma, pleuritic pain, productive sputum, cough, dyspnea and wheezing.  Gastrointestinal: Denied, gastro-esophageal reflux, melena, nausea and vomiting.  Genitourinary: Denied genitourinary symptoms including symptomatic vaginal discharge, pelvic relaxation issues, and urinary complaints.  Musculoskeletal: Denied musculoskeletal symptoms, stiffness, swelling, muscle weakness and myalgia.  Dermatologic: Denied dermatology symptoms, rash and scar.  Neurologic: Denied neurology symptoms,  dizziness, headache, neck pain and syncope.  Psychiatric: Denied psychiatric symptoms, anxiety and depression.  Endocrine: Denied endocrine symptoms including hot flashes and night sweats.   Meds:   Current Outpatient Prescriptions on File Prior to Visit  Medication Sig Dispense Refill  . ACCU-CHEK FASTCLIX LANCETS MISC 1 Units by Percutaneous route 4 (four) times daily. 100 each 12  . glucose blood (ACCU-CHEK GUIDE) test strip Use four times daily as directed. 100 each 12  . glyBURIDE (DIABETA) 2.5 MG tablet Take 1 tablet (2.5 mg total) by mouth 2 (two) times daily with a meal. (Patient not taking: Reported on 08/13/2016) 60 tablet 3  . Prenat-FeFmCb-DSS-FA-DHA w/o A (CITRANATAL HARMONY) 27-1-260 MG CAPS Take 27 mg by mouth daily. (Patient not taking: Reported on 08/13/2016) 30 capsule 11   No current facility-administered medications on file prior to visit.     Objective:     Vitals:   08/13/16 0836  BP: 115/79  Pulse: 80              Pelvic examination   Pelvic:   Vulva: Normal appearance.  No lesions.  No abnormal scarring.    Vagina: No lesions or abnormalities noted.  Support: Normal pelvic support.  Urethra No masses tenderness or scarring.  Meatus Normal size without lesions or prolapse.  Cervix: Normal ectropion.  No lesions.  Anus: Normal exam.  No lesions.  Perineum: Normal exam.  No lesions.  Healed well.          Bimanual   Uterus: Normal size.  Non-tender.  Mobile.  AV.  Adnexae: No masses.  Non-tender to palpation.  Cul-de-sac: Negative for abnormality.     Assessment:    [id number] Patient Active  Problem List   Diagnosis Date Noted  . Gestational diabetes mellitus (GDM) controlled on oral hypoglycemic drug 06/27/2016  . Excessive fetal growth affecting management of mother in third trimester, antepartum 06/25/2016  . Labor and delivery, indication for care 06/24/2016  . Indication for care in labor or delivery 06/17/2016  . Group B Streptococcus carrier,  +RV culture, currently pregnant 06/17/2016  . Supervision of high risk elderly multigravida in third trimester 06/17/2016  . Gestational diabetes mellitus (GDM) in third trimester 04/24/2016  . Sleep disturbances 02/20/2016  . Chlamydia infection affecting pregnancy in first trimester 01/27/2016  . Obesity in pregnancy 12/28/2015  . H/O gestational diabetes in prior pregnancy, currently pregnant 12/28/2015  . H/O pre-eclampsia in prior pregnancy, currently pregnant 12/28/2015  . Tobacco use disorder, mild, in sustained remission 12/28/2015     1. Postpartum care and examination immediately after delivery     Normal postpartum exam. 2.  Birth Control I discussed multiple birth control options and methods with the patient.  The risks and benefits of each were reviewed. IUD Literature on [place], Kyleena and Skyla given.  Risks and benefits of each discussed.  She is considering IUD as an option for birth/cycle control.   Plan:            1.  She has chosen IUD for birth control. She will present at the time of her next menses for IUD placement. May resume normal activities with the exception of heavy lifting.     F/U  Return for She is to call at the start of next menses.  Elonda Huskyavid J. Evans, M.D. 08/13/2016 10:20 AM

## 2016-09-03 ENCOUNTER — Encounter: Payer: Medicaid Other | Admitting: Obstetrics and Gynecology

## 2016-09-04 ENCOUNTER — Ambulatory Visit (INDEPENDENT_AMBULATORY_CARE_PROVIDER_SITE_OTHER): Payer: Medicaid Other | Admitting: Obstetrics and Gynecology

## 2016-09-04 ENCOUNTER — Encounter: Payer: Self-pay | Admitting: Obstetrics and Gynecology

## 2016-09-04 VITALS — BP 107/70 | HR 86 | Ht 68.0 in | Wt 260.1 lb

## 2016-09-04 DIAGNOSIS — Z30430 Encounter for insertion of intrauterine contraceptive device: Secondary | ICD-10-CM | POA: Diagnosis not present

## 2016-09-04 NOTE — Progress Notes (Signed)
HPI:      [name] is a 26 y.o. [phone] who LMP was Patient's last menstrual period was 08/30/2016 (exact date).  Subjective:   She presents today for IUD insertion.  She is currently on her menstrual period.  She has a negative pregnancy test and states that she has not had intercourse more than 2 weeks.    Hx: The following portions of the patient's history were reviewed and updated as appropriate:             She  has a past medical history of History of gestational diabetes; History of pre-eclampsia; and Migraines. She  does not have any pertinent problems on file. She  has a past surgical history that includes Cholecystectomy. Her family history is not on file. She  reports that she quit smoking about 10 months ago. She smoked 0.25 packs per day. She has never used smokeless tobacco. She reports that she does not drink alcohol or use drugs. She has No Known Allergies.       Review of Systems:  Review of Systems  Constitutional: Denied constitutional symptoms, night sweats, recent illness, fatigue, fever, insomnia and weight loss.  Eyes: Denied eye symptoms, eye pain, photophobia, vision change and visual disturbance.  Ears/Nose/Throat/Neck: Denied ear, nose, throat or neck symptoms, hearing loss, nasal discharge, sinus congestion and sore throat.  Cardiovascular: Denied cardiovascular symptoms, arrhythmia, chest pain/pressure, edema, exercise intolerance, orthopnea and palpitations.  Respiratory: Denied pulmonary symptoms, asthma, pleuritic pain, productive sputum, cough, dyspnea and wheezing.  Gastrointestinal: Denied, gastro-esophageal reflux, melena, nausea and vomiting.  Genitourinary: Denied genitourinary symptoms including symptomatic vaginal discharge, pelvic relaxation issues, and urinary complaints.  Musculoskeletal: Denied musculoskeletal symptoms, stiffness, swelling, muscle weakness and myalgia.  Dermatologic: Denied dermatology symptoms, rash and scar.   Neurologic: Denied neurology symptoms, dizziness, headache, neck pain and syncope.  Psychiatric: Denied psychiatric symptoms, anxiety and depression.  Endocrine: Denied endocrine symptoms including hot flashes and night sweats.   Meds:   Current Outpatient Prescriptions on File Prior to Visit  Medication Sig Dispense Refill  . ACCU-CHEK FASTCLIX LANCETS MISC 1 Units by Percutaneous route 4 (four) times daily. 100 each 12  . glucose blood (ACCU-CHEK GUIDE) test strip Use four times daily as directed. 100 each 12  . glyBURIDE (DIABETA) 2.5 MG tablet Take 1 tablet (2.5 mg total) by mouth 2 (two) times daily with a meal. (Patient not taking: Reported on 08/13/2016) 60 tablet 3  . Prenat-FeFmCb-DSS-FA-DHA w/o A (CITRANATAL HARMONY) 27-1-260 MG CAPS Take 27 mg by mouth daily. (Patient not taking: Reported on 08/13/2016) 30 capsule 11   No current facility-administered medications on file prior to visit.     Objective:     Vitals:   09/04/16 1005  BP: 107/70  Pulse: 86    Physical examination   Pelvic:   Vulva: Normal appearance.  No lesions.  Vagina: No lesions or abnormalities noted.  Support: Normal pelvic support.  Urethra No masses tenderness or scarring.  Meatus Normal size without lesions or prolapse.  Cervix: Normal appearance.  No lesions.  Anus: Normal exam.  No lesions.  Perineum: Normal exam.  No lesions.        Bimanual   Uterus: Normal size.  Non-tender.  Mobile.  AV.  Adnexae: No masses.  Non-tender to palpation.  Cul-de-sac: Negative for abnormality.   IUD Procedure Pt has read the booklet and signed the appropriate forms regarding the Mirena IUD.  All of her questions have been answered.  The cervix was cleansed with betadine solution.  After sounding the uterus and noting the position, the IUD was placed in the usual manner without problem.  The string was cut to the appropriate length.  The patient tolerated the procedure well.              Assessment:     [id number] Patient Active Problem List   Diagnosis Date Noted  . Gestational diabetes mellitus (GDM) controlled on oral hypoglycemic drug 06/27/2016  . Excessive fetal growth affecting management of mother in third trimester, antepartum 06/25/2016  . Labor and delivery, indication for care 06/24/2016  . Indication for care in labor or delivery 06/17/2016  . Group B Streptococcus carrier, +RV culture, currently pregnant 06/17/2016  . Supervision of high risk elderly multigravida in third trimester 06/17/2016  . Gestational diabetes mellitus (GDM) in third trimester 04/24/2016  . Sleep disturbances 02/20/2016  . Chlamydia infection affecting pregnancy in first trimester 01/27/2016  . Obesity in pregnancy 12/28/2015  . H/O gestational diabetes in prior pregnancy, currently pregnant 12/28/2015  . H/O pre-eclampsia in prior pregnancy, currently pregnant 12/28/2015  . Tobacco use disorder, mild, in sustained remission 12/28/2015     1. Encounter for insertion of mirena IUD       Plan:             F/U  Return in about 1 month (around 10/04/2016).  Elonda Huskyavid J. Lillybeth Tal, M.D. 09/04/2016 10:36 AM

## 2016-10-07 ENCOUNTER — Ambulatory Visit (INDEPENDENT_AMBULATORY_CARE_PROVIDER_SITE_OTHER): Payer: Medicaid Other | Admitting: Obstetrics and Gynecology

## 2016-10-07 ENCOUNTER — Encounter: Payer: Self-pay | Admitting: Obstetrics and Gynecology

## 2016-10-07 VITALS — BP 118/73 | HR 75 | Ht 68.0 in | Wt 263.4 lb

## 2016-10-07 DIAGNOSIS — Z975 Presence of (intrauterine) contraceptive device: Secondary | ICD-10-CM

## 2016-10-07 DIAGNOSIS — N921 Excessive and frequent menstruation with irregular cycle: Secondary | ICD-10-CM

## 2016-10-07 NOTE — Progress Notes (Signed)
HPI:      [name] is a 26 y.o. [phone] who LMP was No LMP recorded. Patient is not currently having periods (Reason: IUD).  Subjective:   She presents today Complaining of daily bleeding with IUD. She says that she occasionally passes clots as well. She has a picture of some clots that she passed. She reports she has not had intercourse with the IUD.    Hx: The following portions of the patient's history were reviewed and updated as appropriate:             She  has a past medical history of History of gestational diabetes; History of pre-eclampsia; and Migraines. She  does not have any pertinent problems on file. She  has a past surgical history that includes Cholecystectomy. Her family history is not on file. She  reports that she quit smoking about a year ago. She smoked 0.25 packs per day. She has never used smokeless tobacco. She reports that she does not drink alcohol or use drugs. She has No Known Allergies.       Review of Systems:  Review of Systems  Constitutional: Denied constitutional symptoms, night sweats, recent illness, fatigue, fever, insomnia and weight loss.  Eyes: Denied eye symptoms, eye pain, photophobia, vision change and visual disturbance.  Ears/Nose/Throat/Neck: Denied ear, nose, throat or neck symptoms, hearing loss, nasal discharge, sinus congestion and sore throat.  Cardiovascular: Denied cardiovascular symptoms, arrhythmia, chest pain/pressure, edema, exercise intolerance, orthopnea and palpitations.  Respiratory: Denied pulmonary symptoms, asthma, pleuritic pain, productive sputum, cough, dyspnea and wheezing.  Gastrointestinal: Denied, gastro-esophageal reflux, melena, nausea and vomiting.  Genitourinary: See HPI for additional information.  Musculoskeletal: Denied musculoskeletal symptoms, stiffness, swelling, muscle weakness and myalgia.  Dermatologic: Denied dermatology symptoms, rash and scar.  Neurologic: Denied neurology symptoms,  dizziness, headache, neck pain and syncope.  Psychiatric: Denied psychiatric symptoms, anxiety and depression.  Endocrine: Denied endocrine symptoms including hot flashes and night sweats.   Meds:   No current outpatient prescriptions on file prior to visit.   No current facility-administered medications on file prior to visit.     Objective:     Vitals:   10/07/16 0953  BP: 118/73  Pulse: 75              Physical examination   Pelvic:   Vulva: Normal appearance.  No lesions.  Vagina: No lesions or abnormalities noted.  Support: Normal pelvic support.  Urethra No masses tenderness or scarring.  Meatus Normal size without lesions or prolapse.  Cervix: Normal appearance.  No lesions. IUD strings noted at cervical os Longer than expected   Anus: Normal exam.  No lesions.  Perineum: Normal exam.  No lesions.        Bimanual   Uterus: Normal size.  Non-tender.  Mobile.  AV.  Adnexae: No masses.  Non-tender to palpation.  Cul-de-sac: Negative for abnormality.     Assessment:    [id number] Patient Active Problem List   Diagnosis Date Noted  . Gestational diabetes mellitus (GDM) controlled on oral hypoglycemic drug 06/27/2016  . Excessive fetal growth affecting management of mother in third trimester, antepartum 06/25/2016  . Labor and delivery, indication for care 06/24/2016  . Indication for care in labor or delivery 06/17/2016  . Group B Streptococcus carrier, +RV culture, currently pregnant 06/17/2016  . Supervision of high risk elderly multigravida in third trimester 06/17/2016  . Gestational diabetes mellitus (GDM) in third trimester 04/24/2016  . Sleep disturbances 02/20/2016  .  Chlamydia infection affecting pregnancy in first trimester 01/27/2016  . Obesity in pregnancy 12/28/2015  . H/O gestational diabetes in prior pregnancy, currently pregnant 12/28/2015  . H/O pre-eclampsia in prior pregnancy, currently pregnant 12/28/2015  . Tobacco use disorder, mild, in  sustained remission 12/28/2015     1. Breakthrough bleeding associated with intrauterine device (IUD)     IUD possibly shifted into the lower uterine segment based on strengthening length and daily bleeding.   Plan:            1.  Discussed with patient and attempted IUD placement to a higher position (successful).  Cervix noted to be somewhat open.  2.  Will prescribe OCPs for 1 month to control bleeding. If patient continues to pass clots consider ultrasound for IUD position.        F/U  Return in about 4 weeks (around 11/04/2016) for Pt to contact us if symptoms worsen.  Elonda Huskyavid J. Sakai Heinle, M.D. 10/07/2016 10:10 AM

## 2016-11-04 ENCOUNTER — Encounter: Payer: Medicaid Other | Admitting: Obstetrics and Gynecology

## 2016-11-06 ENCOUNTER — Encounter: Payer: Self-pay | Admitting: Obstetrics and Gynecology

## 2016-11-06 ENCOUNTER — Ambulatory Visit (INDEPENDENT_AMBULATORY_CARE_PROVIDER_SITE_OTHER): Payer: Medicaid Other | Admitting: Obstetrics and Gynecology

## 2016-11-12 ENCOUNTER — Emergency Department
Admission: EM | Admit: 2016-11-12 | Discharge: 2016-11-12 | Disposition: A | Discharge status: Home / Self Care | Payer: Medicaid Other | Attending: Emergency Medicine | Admitting: Emergency Medicine

## 2016-11-12 ENCOUNTER — Emergency Department: Payer: Medicaid Other

## 2016-11-12 DIAGNOSIS — Z87891 Personal history of nicotine dependence: Secondary | ICD-10-CM | POA: Diagnosis not present

## 2016-11-12 DIAGNOSIS — G43809 Other migraine, not intractable, without status migrainosus: Secondary | ICD-10-CM | POA: Diagnosis not present

## 2016-11-12 DIAGNOSIS — T8332XA Displacement of intrauterine contraceptive device, initial encounter: Secondary | ICD-10-CM | POA: Insufficient documentation

## 2016-11-12 DIAGNOSIS — Z79899 Other long term (current) drug therapy: Secondary | ICD-10-CM | POA: Insufficient documentation

## 2016-11-12 DIAGNOSIS — N939 Abnormal uterine and vaginal bleeding, unspecified: Secondary | ICD-10-CM

## 2016-11-12 DIAGNOSIS — Y763 Surgical instruments, materials and obstetric and gynecological devices (including sutures) associated with adverse incidents: Secondary | ICD-10-CM | POA: Diagnosis not present

## 2016-11-12 DIAGNOSIS — R102 Pelvic and perineal pain: Secondary | ICD-10-CM | POA: Diagnosis not present

## 2016-11-12 LAB — BASIC METABOLIC PANEL
Anion gap: 7 (ref 5–15)
BUN: 16 mg/dL (ref 6–20)
CALCIUM: 9.6 mg/dL (ref 8.9–10.3)
CO2: 26 mmol/L (ref 22–32)
CREATININE: 1.01 mg/dL — AB (ref 0.44–1.00)
Chloride: 107 mmol/L (ref 101–111)
Glucose, Bld: 119 mg/dL — ABNORMAL HIGH (ref 65–99)
Potassium: 4.2 mmol/L (ref 3.5–5.1)
SODIUM: 140 mmol/L (ref 135–145)

## 2016-11-12 LAB — WET PREP, GENITAL
Clue Cells Wet Prep HPF POC: NONE SEEN
SPERM: NONE SEEN
TRICH WET PREP: NONE SEEN
YEAST WET PREP: NONE SEEN

## 2016-11-12 LAB — URINALYSIS, COMPLETE (UACMP) WITH MICROSCOPIC
BACTERIA UA: NONE SEEN
BILIRUBIN URINE: NEGATIVE
GLUCOSE, UA: NEGATIVE mg/dL
Ketones, ur: NEGATIVE mg/dL
NITRITE: NEGATIVE
PH: 5 (ref 5.0–8.0)
Protein, ur: NEGATIVE mg/dL
SPECIFIC GRAVITY, URINE: 1.02 (ref 1.005–1.030)

## 2016-11-12 LAB — CBC
HCT: 39.3 % (ref 35.0–47.0)
Hemoglobin: 13.3 g/dL (ref 12.0–16.0)
MCH: 27.1 pg (ref 26.0–34.0)
MCHC: 33.9 g/dL (ref 32.0–36.0)
MCV: 79.9 fL — ABNORMAL LOW (ref 80.0–100.0)
PLATELETS: 330 10*3/uL (ref 150–440)
RBC: 4.92 MIL/uL (ref 3.80–5.20)
RDW: 14.4 % (ref 11.5–14.5)
WBC: 9.5 10*3/uL (ref 3.6–11.0)

## 2016-11-12 LAB — POCT PREGNANCY, URINE: Preg Test, Ur: NEGATIVE

## 2016-11-12 LAB — CHLAMYDIA/NGC RT PCR (ARMC ONLY)
Chlamydia Tr: NOT DETECTED
N gonorrhoeae: NOT DETECTED

## 2016-11-12 MED ORDER — METOCLOPRAMIDE HCL 10 MG PO TABS
10.0000 mg | ORAL_TABLET | Freq: Once | ORAL | Status: AC
  Administered 2016-11-12: 10 mg via ORAL

## 2016-11-12 MED ORDER — METOCLOPRAMIDE HCL 10 MG PO TABS
ORAL_TABLET | ORAL | Status: AC
  Filled 2016-11-12: qty 1

## 2016-11-12 MED ORDER — SODIUM CHLORIDE 0.9 % IV BOLUS (SEPSIS): 1000.0000 mL | Freq: Once | INTRAVENOUS | Status: DC

## 2016-11-12 MED ORDER — KETOROLAC TROMETHAMINE 60 MG/2ML IM SOLN
INTRAMUSCULAR | Status: AC
  Filled 2016-11-12: qty 2

## 2016-11-12 MED ORDER — KETOROLAC TROMETHAMINE 30 MG/ML IJ SOLN: 30.0000 mg | Freq: Once | INTRAMUSCULAR | Status: DC

## 2016-11-12 MED ORDER — KETOROLAC TROMETHAMINE 60 MG/2ML IM SOLN
60.0000 mg | Freq: Once | INTRAMUSCULAR | Status: AC
  Administered 2016-11-12: 60 mg via INTRAMUSCULAR

## 2016-11-12 MED ORDER — PROCHLORPERAZINE MALEATE 10 MG PO TABS: 10.0000 mg | ORAL_TABLET | Freq: Once | ORAL | Status: DC

## 2016-11-12 NOTE — ED Notes (Signed)
E-signature pad not working. Printed e-signature page out and pt signed on paper.

## 2016-11-12 NOTE — ED Notes (Signed)
Pt taken to US via stretcher

## 2016-11-12 NOTE — ED Triage Notes (Signed)
Pt presents to ED via POV with v/o vaginal pain and bleeding and migraine. Pt c/o sensitivity to light and sound at this time, states hx of migraines. Pt states had a baby in April, and had an IUD placed in May. Pt states since then had 4 months of bleeding/spotting, was placed in birth control pill which stopped the bleeding. Today woke up with sudden onset sharp pain in her pelvic and vaginal bleeding that started today. Pt denies recent sexual intercourse. Pt states this morning she noticed pink when she wipes after using the bathroom but states that the bleeding is getting mildly heavier throughout the day.

## 2016-11-12 NOTE — ED Provider Notes (Signed)
[facility] Emergency Department Provider Note  ____________________________________________  Time seen: Approximately 1:17 PM  I have reviewed the triage vital signs and the nursing notes.   HISTORY  Chief Complaint Vaginal Bleeding and Migraine    HPI Marie Palmer is a 26 y.o. female w/ a hx of dysfunctional uterine bleeding after IUD placement, hx of PCOS, presenting w/ lower abd cramping and mild vaginal bleeding.  The pt reports that she had a baby in April, had a 6wk postpartum IUD placement and developed dysfunctional uterine bleeding for months afterwards.  Her gyn placed her on OCPs, and approximately one week ago she stopped having breakthrough bleeding. Last night, she woke up at 2 AM with severe suprapubic cramping and noticed blood when she wiped only. She denies any lightheadedness or shortness of breath, fever or chills. No change in vaginal discharge.the patient also notes that she often gets migraines in the setting of pain, does feel a typical headache that is unchanged in character or severity compared to her usual migraine. Not tried anything for her pain.   Past Medical History:  Diagnosis Date  . History of gestational diabetes   . History of pre-eclampsia   . Migraines     Patient Active Problem List   Diagnosis Date Noted  . Gestational diabetes mellitus (GDM) controlled on oral hypoglycemic drug 06/27/2016  . Excessive fetal growth affecting management of mother in third trimester, antepartum 06/25/2016  . Labor and delivery, indication for care 06/24/2016  . Indication for care in labor or delivery 06/17/2016  . Group B Streptococcus carrier, +RV culture, currently pregnant 06/17/2016  . Supervision of high risk elderly multigravida in third trimester 06/17/2016  . Gestational diabetes mellitus (GDM) in third trimester 04/24/2016  . Sleep disturbances 02/20/2016  . Chlamydia infection affecting pregnancy in first trimester  01/27/2016  . Obesity in pregnancy 12/28/2015  . H/O gestational diabetes in prior pregnancy, currently pregnant 12/28/2015  . H/O pre-eclampsia in prior pregnancy, currently pregnant 12/28/2015  . Tobacco use disorder, mild, in sustained remission 12/28/2015    Past Surgical History:  Procedure Laterality Date  . CHOLECYSTECTOMY      Current Outpatient Rx  . Order #: [id number] Class: Historical Med    Allergies Patient has no known allergies.  No family history on file.  Social History Social History  Substance Use Topics  . Smoking status: Former Smoker    Packs/day: 0.25    Quit date: 10/23/2015  . Smokeless tobacco: Never Used     Comment: cessation after discovery of pregnancy  . Alcohol use No    Review of Systems Constitutional: No fever/chills.lightheadedness or syncope. Eyes: No visual changes. ENT: No sore throat. No congestion or rhinorrhea. Cardiovascular: Denies chest pain. Denies palpitations. Respiratory: Denies shortness of breath.  No cough. Gastrointestinal: positive suprapubicabdominal cramping.  No nausea, no vomiting.  No diarrhea.  No constipation. Genitourinary: Negative for dysuria. Positive vaginal bleeding. No change in vaginal discharge. Musculoskeletal: Negative for back pain. Skin: Negative for rash. Neurological:positive for typical migraine. No focal numbness, tingling or weakness.     ____________________________________________   PHYSICAL EXAM:  VITAL SIGNS: ED Triage Vitals  Enc Vitals Group     BP 11/12/16 1202 124/61     Pulse Rate 11/12/16 1202 94     Resp 11/12/16 1202 18     Temp 11/12/16 1202 98.5 F (36.9 C)     Temp Source 11/12/16 1202 Oral     SpO2 11/12/16 1202 94 %  Weight 11/12/16 1202 240 lb (108.9 kg)     Height 11/12/16 1202 5\' 8"  (1.727 m)     Head Circumference --      Peak Flow --      Pain Score 11/12/16 1201 9     Pain Loc --      Pain Edu? --      Excl. in GC? --     Constitutional: Alert  and oriented. Well appearing and in no acute distress. Answers questions appropriately. Eyes: Conjunctivae are normal.  EOMI. No scleral icterus. Head: Atraumatic. Nose: No congestion/rhinnorhea. Mouth/Throat: Mucous membranes are moist.  Neck: No stridor.  Supple.   Cardiovascular: Normal rate, regular rhythm. No murmurs, rubs or gallops.  Respiratory: Normal respiratory effort.  No accessory muscle use or retractions. Lungs CTAB.  No wheezes, rales or ronchi. Gastrointestinal: Obese. Soft, and nondistended.  ild suprapubic tenderness to palpation.No guarding or rebound.  No peritoneal signs. Genitourinary: Normal-appearing external genitalia without lesions. Vaginal exam with clear discharge and minimal blood, no active bleeding, normal-appearing cervix with visible IUD strings, normal vaginal wall tissue. Bimanual exam is negative for CMT, adnexal tenderness to palpation, no palpable masses. Musculoskeletal: No LE edema.  Neurologic:  A&Ox3.  Speech is clear.  Face and smile are symmetric.  EOMI.  Moves all extremities well. Skin:  Skin is warm, dry and intact. No rash noted. Psychiatric: Mood and affect are normal. Speech and behavior are normal.  Normal judgement.  ____________________________________________   LABS (all labs ordered are listed, but only abnormal results are displayed)  Labs Reviewed  CBC - Abnormal; Notable for the following:       Result Value   MCV 79.9 (*)    All other components within normal limits  BASIC METABOLIC PANEL - Abnormal; Notable for the following:    Glucose, Bld 119 (*)    Creatinine, Ser 1.01 (*)    All other components within normal limits  URINALYSIS, COMPLETE (UACMP) WITH MICROSCOPIC - Abnormal; Notable for the following:    Color, Urine YELLOW (*)    APPearance CLEAR (*)    Hgb urine dipstick LARGE (*)    Leukocytes, UA SMALL (*)    Squamous Epithelial / LPF 0-5 (*)    All other components within normal limits  POC URINE PREG, ED   POCT PREGNANCY, URINE   ____________________________________________  EKG  Not indicated ____________________________________________  RADIOLOGY  US Transvaginal Non-ob  Result Date: 11/12/2016 CLINICAL DATA:  Diffuse pelvic pain for 12 weeks. IUD placement in May 2018. Patient delivered a baby in April 2018. EXAM: TRANSABDOMINAL AND TRANSVAGINAL ULTRASOUND OF PELVIS TECHNIQUE: Both transabdominal and transvaginal ultrasound examinations of the pelvis were performed. Transabdominal technique was performed for global imaging of the pelvis including uterus, ovaries, adnexal regions, and pelvic cul-de-sac. It was necessary to proceed with endovaginal exam following the transabdominal exam to visualize the endometrium and adnexa. COMPARISON:  11/21/2015 FINDINGS: Uterus Measurements: 9.6 x 4.5 x 4.7 cm. No fibroids or other mass visualized. There is an IUD, which appears to be positioned in the lower uterine segment, and is potentially malrotated. Endometrium Thickness: 7 mm.  No focal abnormality visualized. Right ovary Measurements: 3.5 x 2.4 x 2.1 cm. Normal appearance/no adnexal mass. Left ovary Measurements: 3.3 x 1.9 x 2.0 cm. Normal appearance/no adnexal mass. Other findings No abnormal free fluid. IMPRESSION: Potential malpositioning of IUD in the lower uterine segment. Normal appearance of the ovaries. Electronically Signed   By: Ted Mcalpine M.D.   On:  11/12/2016 14:11   US Pelvis Complete  Result Date: 11/12/2016 CLINICAL DATA:  Diffuse pelvic pain for 12 weeks. IUD placement in May 2018. Patient delivered a baby in April 2018. EXAM: TRANSABDOMINAL AND TRANSVAGINAL ULTRASOUND OF PELVIS TECHNIQUE: Both transabdominal and transvaginal ultrasound examinations of the pelvis were performed. Transabdominal technique was performed for global imaging of the pelvis including uterus, ovaries, adnexal regions, and pelvic cul-de-sac. It was necessary to proceed with endovaginal exam following  the transabdominal exam to visualize the endometrium and adnexa. COMPARISON:  11/21/2015 FINDINGS: Uterus Measurements: 9.6 x 4.5 x 4.7 cm. No fibroids or other mass visualized. There is an IUD, which appears to be positioned in the lower uterine segment, and is potentially malrotated. Endometrium Thickness: 7 mm.  No focal abnormality visualized. Right ovary Measurements: 3.5 x 2.4 x 2.1 cm. Normal appearance/no adnexal mass. Left ovary Measurements: 3.3 x 1.9 x 2.0 cm. Normal appearance/no adnexal mass. Other findings No abnormal free fluid. IMPRESSION: Potential malpositioning of IUD in the lower uterine segment. Normal appearance of the ovaries. Electronically Signed   By: Ted Mcalpine M.D.   On: 11/12/2016 14:11    ____________________________________________   PROCEDURES  Procedure(s) performed: None  Procedures  Critical Care performed: No ____________________________________________   INITIAL IMPRESSION / ASSESSMENT AND PLAN / ED COURSE  Pertinent labs & imaging results that were available during my care of the patient were reviewed by me and considered in my medical decision making (see chart for details).  26 y.o. w/ a hx of dysfunctional uterine bleeding presenting with abdominal cramping and mild vaginal bleeding. Overall, the patient is hemodynamically stable and has a reassuring abdominal examination. We'll get an ultrasound to evaluate the placement of her IUD and to rule out any ovarian or uterine pathology. Will get blood counts to rule out anemia. I will also perform a pelvic examination to rule out infection. Treatment for her pain and migraine have been initiated.Plan reevaluation for final disposition.  ----------------------------------------- 2:16 PM on 11/12/2016 -----------------------------------------  The patient has undergone pelvic examination and I'm awaiting the results of her wet prep. Her ultrasound does show possibly malrotated IUD, so I'll speak  with the patient's OB/GYN for likely outpatient management.  At this time, the patient's pain both in her suprapubic area and migraine, have significantly improved.  ----------------------------------------- 2:21 PM on 11/12/2016 -----------------------------------------   I've spoken with Dr. Logan Bores, who will see the patient in clinic tomorrow.At this time, the patient is stable for discharge. Return precautions as well as follow-up instructions were discussed  ____________________________________________  FINAL CLINICAL IMPRESSION(S) / ED DIAGNOSES  Final diagnoses:  Other migraine without status migrainosus, not intractable  Displacement of intrauterine contraceptive device, initial encounter  Pelvic cramping  Vaginal bleeding         NEW MEDICATIONS STARTED DURING THIS VISIT:  New Prescriptions   No medications on file      Rockne Menghini, MD 11/12/16 1421

## 2016-11-12 NOTE — Discharge Instructions (Signed)
Please drink plenty of fluid to stay well-hydrated. You may take Tylenol or Motrin for ear pain.  Return to the emergency department if you develop severe pain, increased bleeding, lightheadedness or fainting, fever, or any other symptoms concerning to you.

## 2016-11-12 NOTE — ED Notes (Addendum)
Pt states vaginal bleeding since IUD placed at [facility]. States was placed on birht control pills to regulate bleeding. States was told that it would take a week but it took a month. States still bleeding. States every time she goes for OB check up IUD has moved. Pt states lower middle abd cramping. Vaginal delivery 06/28/16. Pt states small amount of bleeding, not going through a pad an hour. States pink in color. More worried about cramping then bleeding.   States when she is in pain she gets a migraine. States it's behind eyes and at ears. States photosensitivity. Alert and oriented, speaking in complete sentences.

## 2016-11-13 ENCOUNTER — Encounter: Payer: Self-pay | Admitting: Obstetrics and Gynecology

## 2016-11-13 ENCOUNTER — Ambulatory Visit (INDEPENDENT_AMBULATORY_CARE_PROVIDER_SITE_OTHER): Payer: Medicaid Other | Admitting: Obstetrics and Gynecology

## 2016-11-13 VITALS — BP 108/71 | HR 78 | Ht 68.0 in | Wt 267.1 lb

## 2016-11-13 DIAGNOSIS — Z30432 Encounter for removal of intrauterine contraceptive device: Secondary | ICD-10-CM

## 2016-11-13 DIAGNOSIS — R102 Pelvic and perineal pain: Secondary | ICD-10-CM | POA: Diagnosis not present

## 2016-11-13 DIAGNOSIS — Z30011 Encounter for initial prescription of contraceptive pills: Secondary | ICD-10-CM | POA: Diagnosis not present

## 2016-11-13 DIAGNOSIS — Z30431 Encounter for routine checking of intrauterine contraceptive device: Secondary | ICD-10-CM | POA: Diagnosis not present

## 2016-11-13 MED ORDER — NORETHIN-ETH ESTRAD-FE BIPHAS 1 MG-10 MCG / 10 MCG PO TABS: 1.0000 | ORAL_TABLET | Freq: Every day | ORAL | 2 refills | Status: DC

## 2016-11-13 NOTE — Progress Notes (Signed)
HPI:      [name] is a 26 y.o. [phone] who LMP was No LMP recorded. Patient is not currently having periods (Reason: IUD).  Subjective:   She presents today After being seen in the emergency department yesterday with complaint of sharp pelvic pain. She reports significant cramping as well. An ultrasound performed in the emergency department reveals the IUD to be positioned low in the uterus. The patient has had issues with bleeding and cramping for the entire life of her IUD to this point. She would like the IUD removed and would like to do something else for birth control.    Hx: The following portions of the patient's history were reviewed and updated as appropriate:             She  has a past medical history of History of gestational diabetes; History of pre-eclampsia; and Migraines. She  does not have any pertinent problems on file. She  has a past surgical history that includes Cholecystectomy. Her family history is not on file. She  reports that she quit smoking about 12 months ago. She smoked 0.25 packs per day. She has never used smokeless tobacco. She reports that she does not drink alcohol or use drugs. She has No Known Allergies.       Review of Systems:  Review of Systems  Constitutional: Denied constitutional symptoms, night sweats, recent illness, fatigue, fever, insomnia and weight loss.  Eyes: Denied eye symptoms, eye pain, photophobia, vision change and visual disturbance.  Ears/Nose/Throat/Neck: Denied ear, nose, throat or neck symptoms, hearing loss, nasal discharge, sinus congestion and sore throat.  Cardiovascular: Denied cardiovascular symptoms, arrhythmia, chest pain/pressure, edema, exercise intolerance, orthopnea and palpitations.  Respiratory: Denied pulmonary symptoms, asthma, pleuritic pain, productive sputum, cough, dyspnea and wheezing.  Gastrointestinal: Denied, gastro-esophageal reflux, melena, nausea and vomiting.  Genitourinary: See HPI for  additional information.  Musculoskeletal: Denied musculoskeletal symptoms, stiffness, swelling, muscle weakness and myalgia.  Dermatologic: Denied dermatology symptoms, rash and scar.  Neurologic: Denied neurology symptoms, dizziness, headache, neck pain and syncope.  Psychiatric: Denied psychiatric symptoms, anxiety and depression.  Endocrine: Denied endocrine symptoms including hot flashes and night sweats.   Meds:   Current Outpatient Prescriptions on File Prior to Visit  Medication Sig Dispense Refill  . levonorgestrel (MIRENA) 20 MCG/24HR IUD 1 each by Intrauterine route once.     No current facility-administered medications on file prior to visit.     Objective:     Vitals:   11/13/16 0934  BP: 108/71  Pulse: 78              Physical examination   Pelvic:   Vulva: Normal appearance.  No lesions.  Vagina: No lesions or abnormalities noted.  Support: Normal pelvic support.  Urethra No masses tenderness or scarring.  Meatus Normal size without lesions or prolapse.  Cervix: Normal appearance.  No lesions.  IUD strings noted   Anus: Normal exam.  No lesions.  Perineum: Normal exam.  No lesions.        Bimanual   Uterus: Normal size.  Non-tender.  Mobile.  AV.  Adnexae: No masses.  Non-tender to palpation.  Cul-de-sac: Negative for abnormality.   IUD Removal Strings of IUD identified and grasped.  IUD removed without problem.  Pt tolerated this well.  IUD noted to be intact.     Assessment:    [id number] Patient Active Problem List   Diagnosis Date Noted  . Gestational diabetes mellitus (GDM) controlled on  oral hypoglycemic drug 06/27/2016  . Excessive fetal growth affecting management of mother in third trimester, antepartum 06/25/2016  . Labor and delivery, indication for care 06/24/2016  . Indication for care in labor or delivery 06/17/2016  . Group B Streptococcus carrier, +RV culture, currently pregnant 06/17/2016  . Supervision of high risk elderly  multigravida in third trimester 06/17/2016  . Gestational diabetes mellitus (GDM) in third trimester 04/24/2016  . Sleep disturbances 02/20/2016  . Chlamydia infection affecting pregnancy in first trimester 01/27/2016  . Obesity in pregnancy 12/28/2015  . H/O gestational diabetes in prior pregnancy, currently pregnant 12/28/2015  . H/O pre-eclampsia in prior pregnancy, currently pregnant 12/28/2015  . Tobacco use disorder, mild, in sustained remission 12/28/2015     1. Pelvic pain in female   2. Surveillance of previously prescribed intrauterine contraceptive device   3. Initiation of OCP (BCP)     Pelvic pain and irregular bleeding possibly secondary to low positioned IUD.   Plan:            1.  IUD removed  2.  Patient would like to discuss other forms of birth control Birth Control I discussed multiple birth control options and methods with the patient.  The risks and benefits of each were reviewed.  3.  She has chosen OCPs  OCPs The risks /benefits of OCPs have been explained to the patient in detail.  Product literature has been given to her.  I have instructed her in the use of OCPs and have given her literature reinforcing this information.  I have explained to the patient that OCPs are not as effective for birth control during the first month of use, and that another form of contraception should be used during this time.  Both first-day start and Sunday start have been explained.  The risks and benefits of each was discussed.  She has been made aware of  the fact that other medications may affect the efficacy of OCPs.  I have answered all of her questions, and I believe that she has an understanding of the effectiveness and use of OCPs.  Orders No orders of the defined types were placed in this encounter.    Meds ordered this encounter  Medications  . Norethindrone-Ethinyl Estradiol-Fe Biphas (LO LOESTRIN FE) 1 MG-10 MCG / 10 MCG tablet    Sig: Take 1 tablet by mouth at  bedtime.    Dispense:  1 Package    Refill:  2        F/U  No Follow-up on file.  [name], M.D. 11/13/2016 10:22 AM

## 2016-12-16 NOTE — Progress Notes (Signed)
Pt not seen due to Dr Logan Bores delivering

## 2017-02-12 ENCOUNTER — Encounter: Payer: Self-pay | Admitting: Obstetrics and Gynecology

## 2017-07-06 IMAGING — CR DG CHEST 2V
2 series · 2 of 2 positions shown · non-contrast
Comparison: Chest radiograph performed 02/28/2015

CLINICAL DATA: Acute onset of syncope. Dizziness and fall.
Congestion and nausea. Initial encounter.

EXAM:
CHEST  2 VIEW

[chest pa]
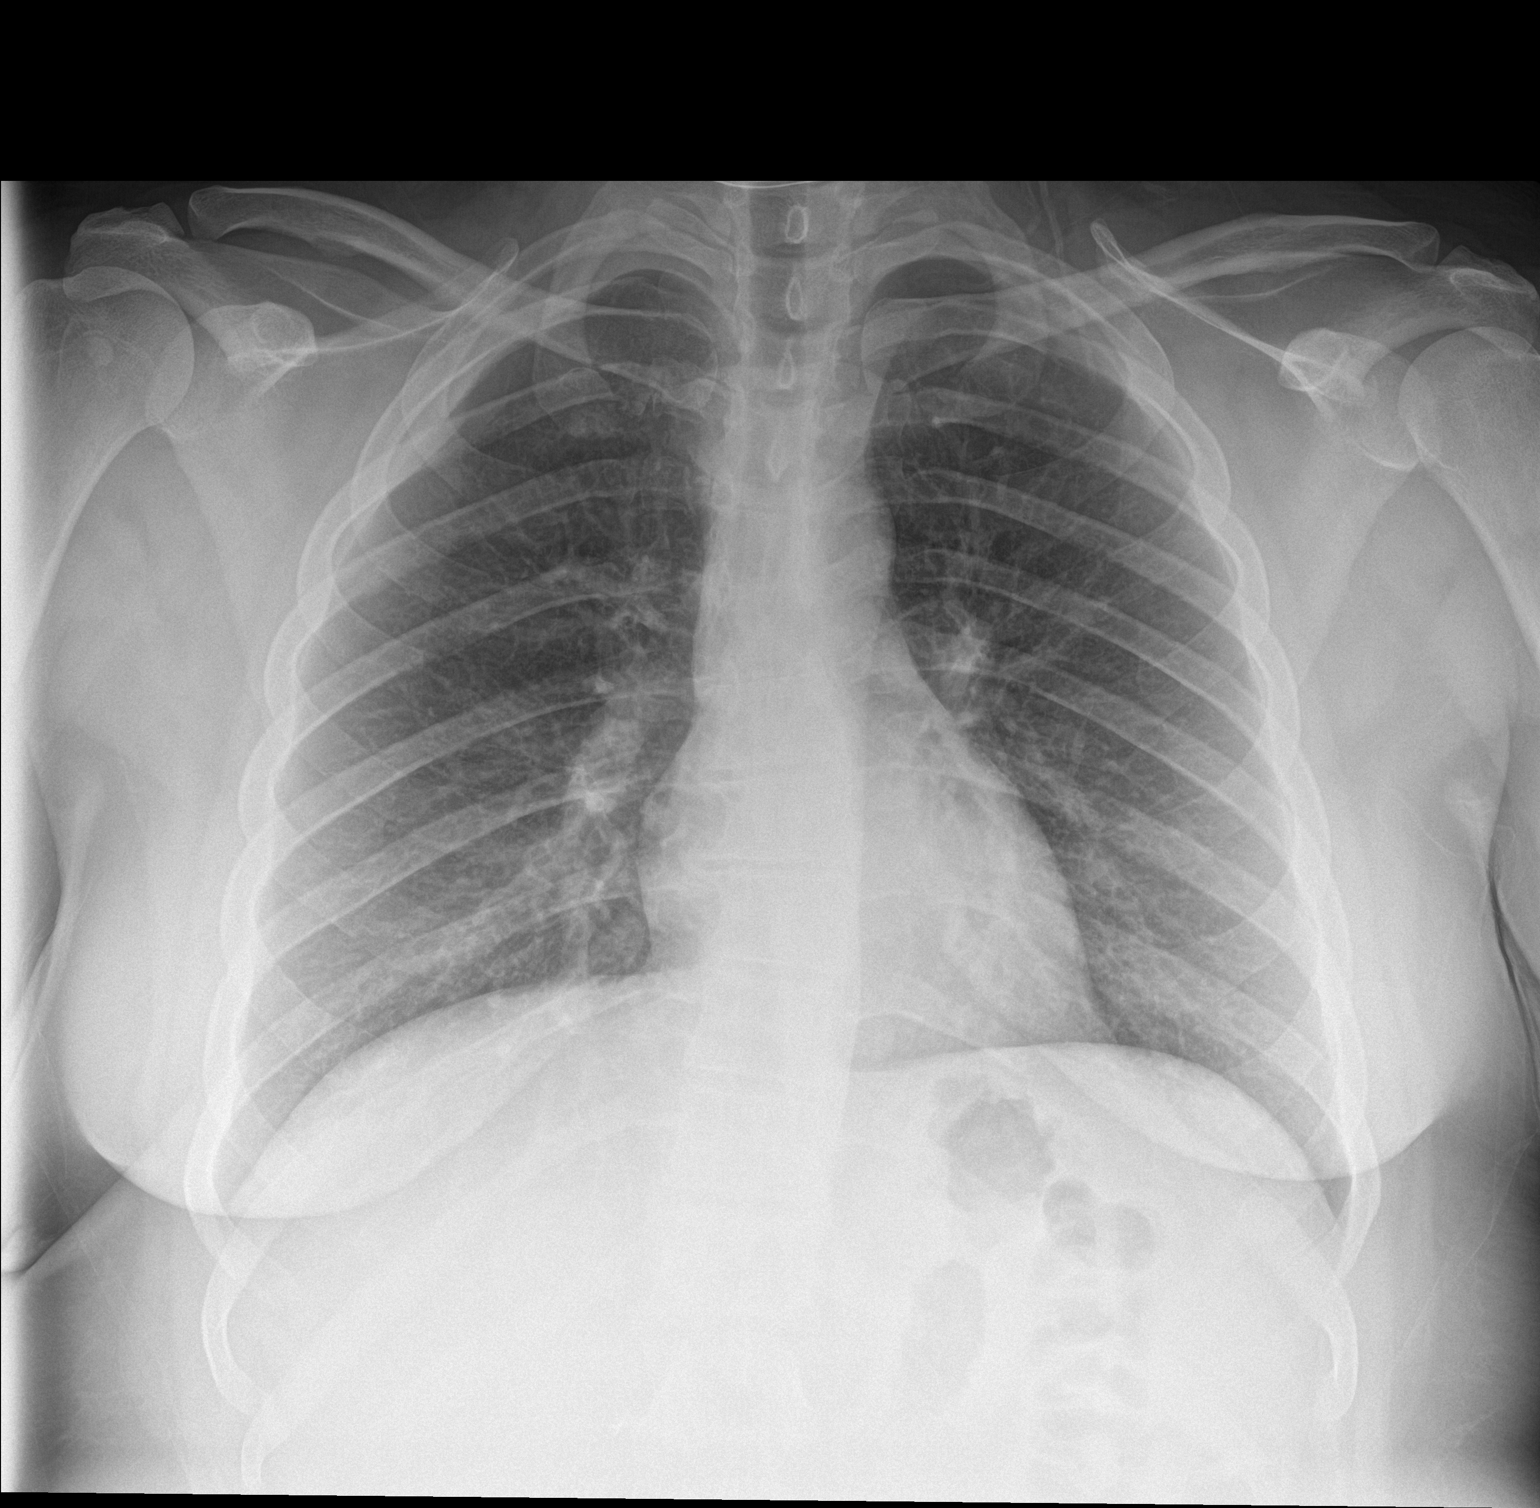

[chest lat]
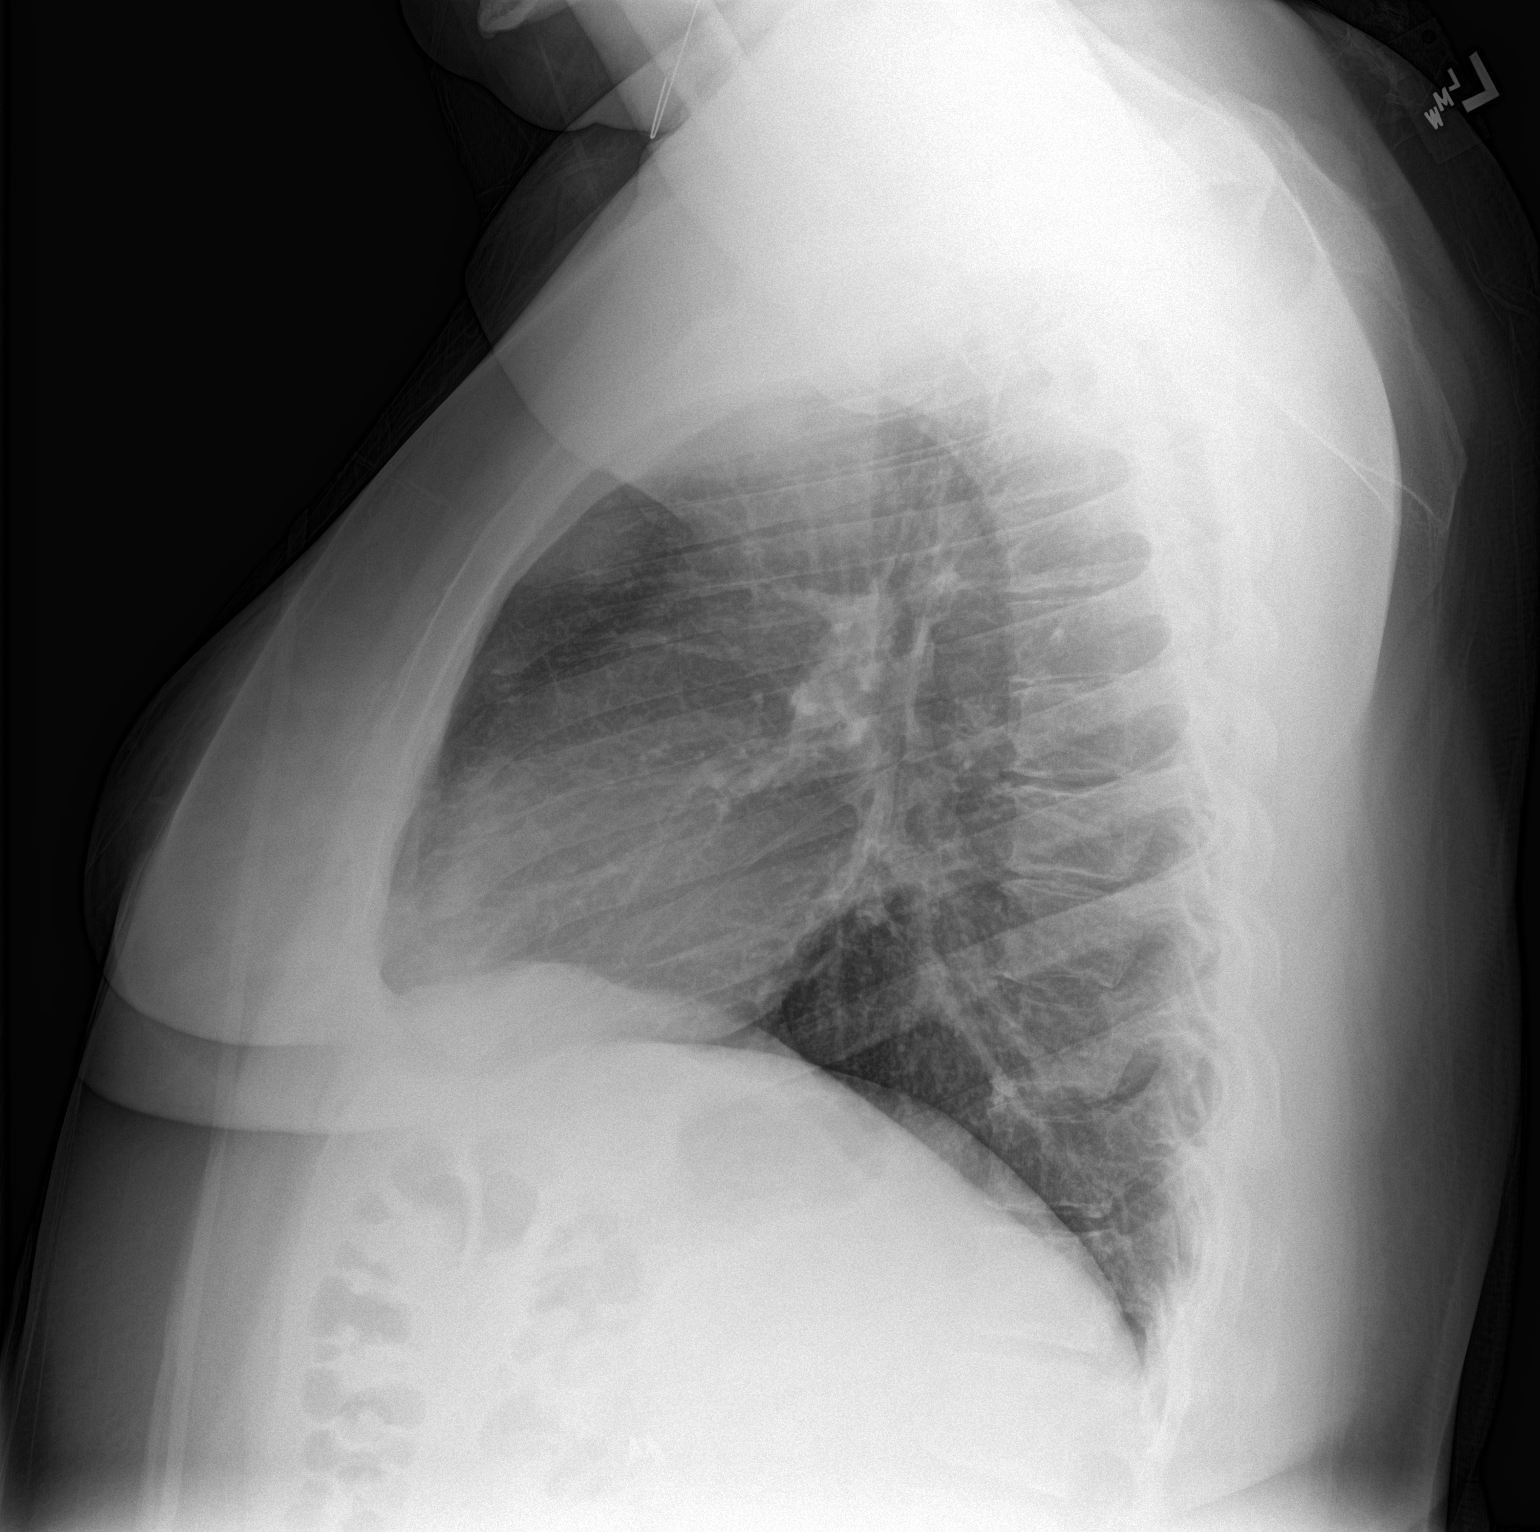

[2 of 2 positions shown; findings below may reference images not displayed]

FINDINGS: The lungs are well-aerated and clear. There is no evidence of focal
opacification, pleural effusion or pneumothorax.

The heart is normal in size; the mediastinal contour is within
normal limits. No acute osseous abnormalities are seen.
IMPRESSION: No acute cardiopulmonary process seen.

## 2017-07-08 ENCOUNTER — Encounter: Payer: Self-pay | Admitting: Obstetrics and Gynecology

## 2017-07-08 ENCOUNTER — Ambulatory Visit (INDEPENDENT_AMBULATORY_CARE_PROVIDER_SITE_OTHER): Payer: Medicaid Other | Admitting: Obstetrics and Gynecology

## 2017-07-08 VITALS — BP 137/88 | HR 75 | Ht 68.0 in | Wt 259.6 lb

## 2017-07-08 DIAGNOSIS — Z0000 Encounter for general adult medical examination without abnormal findings: Secondary | ICD-10-CM | POA: Diagnosis not present

## 2017-07-08 DIAGNOSIS — O99345 Other mental disorders complicating the puerperium: Secondary | ICD-10-CM

## 2017-07-08 DIAGNOSIS — F53 Postpartum depression: Secondary | ICD-10-CM | POA: Diagnosis not present

## 2017-07-08 DIAGNOSIS — N938 Other specified abnormal uterine and vaginal bleeding: Secondary | ICD-10-CM

## 2017-07-08 MED ORDER — SERTRALINE HCL 50 MG PO TABS: 50.0000 mg | ORAL_TABLET | Freq: Every day | ORAL | 1 refills | Status: DC

## 2017-07-08 NOTE — Progress Notes (Signed)
Pt is having heavy periods lasting 7-9 days. Using 2 pads and charging them q 1 hour having a lot of clots that are bigger than a quarter with cramping. Want to know if she can get her tube tied.

## 2017-07-08 NOTE — Progress Notes (Signed)
HPI:      [name] is a 27 y.o. [phone] who LMP was Patient's last menstrual period was 06/15/2017.  Subjective:   She presents today for her annual examination.  She is complaining of very heavy menstrual bleeding 7-9 days/month and passing clots.  She took OCPs for 3 months and states that she had bleeding every single day and heavier bleeding with her menses.  She reports that "they did not help at all".  She has again expressed that all of the  women in her family have a history of endometriosis. She feels that she has completed childbearing. She is complaining of significant mood changes which affect her daily.  She has a history of depression.  She has a very difficult social situation which is adding to the stress of her life.    Hx: The following portions of the patient's history were reviewed and updated as appropriate:             She  has a past medical history of History of gestational diabetes, History of pre-eclampsia, and Migraines. She does not have any pertinent problems on file. She  has a past surgical history that includes Cholecystectomy. Her family history includes Endometriosis in her maternal aunt and mother; Ovarian cancer in her mother. She  reports that she quit smoking about 20 months ago. She smoked 0.25 packs per day. She has never used smokeless tobacco. She reports that she does not drink alcohol or use drugs. She has a current medication list which includes the following prescription(s): sertraline. She has No Known Allergies.       Review of Systems:  Review of Systems  Constitutional: Denied constitutional symptoms, night sweats, recent illness, fatigue, fever, insomnia and weight loss.  Eyes: Denied eye symptoms, eye pain, photophobia, vision change and visual disturbance.  Ears/Nose/Throat/Neck: Denied ear, nose, throat or neck symptoms, hearing loss, nasal discharge, sinus congestion and sore throat.  Cardiovascular: Denied cardiovascular  symptoms, arrhythmia, chest pain/pressure, edema, exercise intolerance, orthopnea and palpitations.  Respiratory: Denied pulmonary symptoms, asthma, pleuritic pain, productive sputum, cough, dyspnea and wheezing.  Gastrointestinal: Denied, gastro-esophageal reflux, melena, nausea and vomiting.  Genitourinary: See HPI for additional information.  Musculoskeletal: Denied musculoskeletal symptoms, stiffness, swelling, muscle weakness and myalgia.  Dermatologic: Denied dermatology symptoms, rash and scar.  Neurologic: Denied neurology symptoms, dizziness, headache, neck pain and syncope.  Psychiatric: Denied psychiatric symptoms, anxiety and depression.  Endocrine: Denied endocrine symptoms including hot flashes and night sweats.   Meds:   No current outpatient medications on file prior to visit.   No current facility-administered medications on file prior to visit.     Objective:     Vitals:   07/08/17 0911  BP: 137/88  Pulse: 75              Physical examination General NAD, Conversant  HEENT Atraumatic; Op clear with mmm.  Normo-cephalic. Pupils reactive. Anicteric sclerae  Thyroid/Neck Smooth without nodularity or enlargement. Normal ROM.  Neck Supple.  Skin No rashes, lesions or ulceration. Normal palpated skin turgor. No nodularity.  Breasts: No masses or discharge.  Symmetric.  No axillary adenopathy.  Lungs: Clear to auscultation.No rales or wheezes. Normal Respiratory effort, no retractions.  Heart: NSR.  No murmurs or rubs appreciated. No periferal edema  Abdomen: Soft.  Non-tender.  No masses.  No HSM. No hernia  Extremities: Moves all appropriately.  Normal ROM for age. No lymphadenopathy.  Neuro: Oriented to PPT.  Normal mood. Normal affect.  Pelvic:   Vulva: Normal appearance.  No lesions.  Vagina: No lesions or abnormalities noted.  Support: Normal pelvic support.  Urethra No masses tenderness or scarring.  Meatus Normal size without lesions or prolapse.   Cervix: Normal appearance.  No lesions.  Anus: Normal exam.  No lesions.  Perineum: Normal exam.  No lesions.        Bimanual   Uterus: 11weeks mildly tender.  Mobile.  AV.  Adnexae: No masses.  Non-tender to palpation.  Cul-de-sac: Negative for abnormality.      Assessment:    [id number] Patient Active Problem List   Diagnosis Date Noted  . Gestational diabetes mellitus (GDM) controlled on oral hypoglycemic drug 06/27/2016  . Excessive fetal growth affecting management of mother in third trimester, antepartum 06/25/2016  . Labor and delivery, indication for care 06/24/2016  . Indication for care in labor or delivery 06/17/2016  . Group B Streptococcus carrier, +RV culture, currently pregnant 06/17/2016  . Supervision of high risk elderly multigravida in third trimester 06/17/2016  . Gestational diabetes mellitus (GDM) in third trimester 04/24/2016  . Sleep disturbances 02/20/2016  . Chlamydia infection affecting pregnancy in first trimester 01/27/2016  . Obesity in pregnancy 12/28/2015  . H/O gestational diabetes in prior pregnancy, currently pregnant 12/28/2015  . H/O pre-eclampsia in prior pregnancy, currently pregnant 12/28/2015  . Tobacco use disorder, mild, in sustained remission 12/28/2015     1. Encounter for annual physical exam   2. DUB (dysfunctional uterine bleeding)   3. Postpartum depression     Patient has failed IUD and OCPs.  Family history of endometriosis.  I believe it is likely she has adenomyosis based on her signs and symptoms and physical exam.  Based on her mood changes and social situation I believe she has some postpartum depression features.  She is requesting medication to help.   Plan:            1.  Basic Screening Recommendations The basic screening recommendations for asymptomatic women were discussed with the patient during her visit.  The age-appropriate recommendations were discussed with her and the rational for the tests reviewed.  When  I am informed by the patient that another primary care physician has previously obtained the age-appropriate tests and they are up-to-date, only outstanding tests are ordered and referrals given as necessary.  Abnormal results of tests will be discussed with her when all of her results are completed. Pap performed 2.  We will begin Zoloft for possible depression 3.  Patient considering multiple options including replacement of IUD, endometrial ablation, hysterectomy.  Before she makes a definitive decision I have asked her to be stable on the Zoloft and to strongly consider her options at that time as she has a significant portion of reproductive life left if she changes her mind.  She has agreed to consider her options very carefully and not make a rash decision until she has more stability in her life. Orders No orders of the defined types were placed in this encounter.    Meds ordered this encounter  Medications  . sertraline (ZOLOFT) 50 MG tablet    Sig: Take 1 tablet (50 mg total) by mouth daily.    Dispense:  30 tablet    Refill:  1        F/U  Return in about 3 weeks (around 07/29/2017).  [name], M.D. 07/08/2017 10:38 AM

## 2017-07-10 LAB — PAP IG, CT-NG, RFX HPV ASCU
Chlamydia, Nuc. Acid Amp: NEGATIVE
Gonococcus by Nucleic Acid Amp: NEGATIVE
PAP Smear Comment: 0

## 2017-07-25 IMAGING — US US OB COMP LESS 14 WK
1 series · 13 of 28 positions shown · non-contrast
Comparison: Pelvic ultrasound performed 05/09/2008

CLINICAL DATA: Acute onset of nausea and dizziness. Initial
encounter.

EXAM:
OBSTETRIC <14 WK US AND TRANSVAGINAL OB US
TECHNIQUE: Both transabdominal and transvaginal ultrasound examinations were
performed for complete evaluation of the gestation as well as the
maternal uterus, adnexal regions, and pelvic cul-de-sac.
Transvaginal technique was performed to assess early pregnancy.

[Series 1: us ob comp less 14 wk · 0.23mm/px · 13 of 59 slices shown]
[im 3/59]
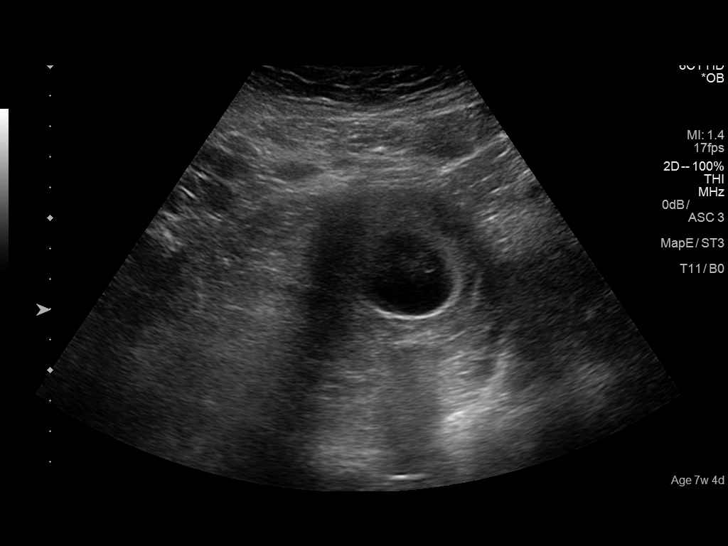
[im 7/59]
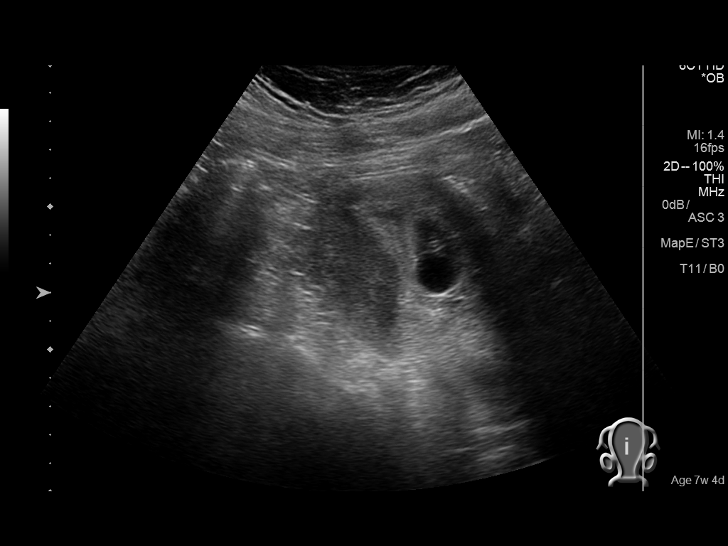
[im 11/59]
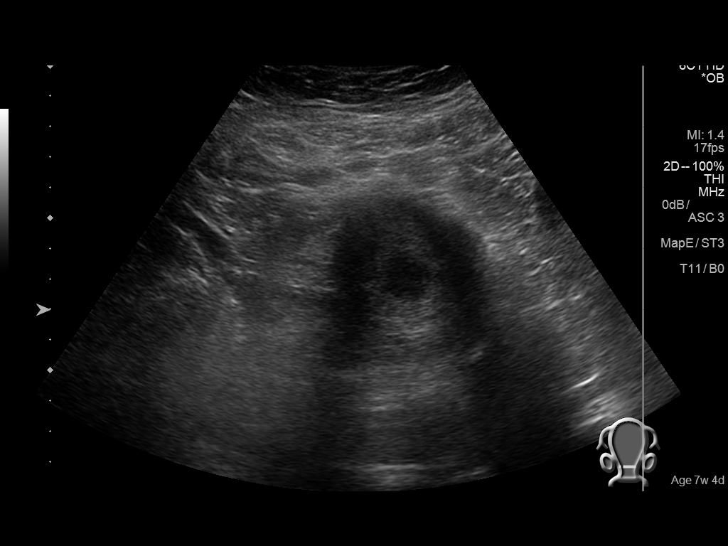
[im 16/59]
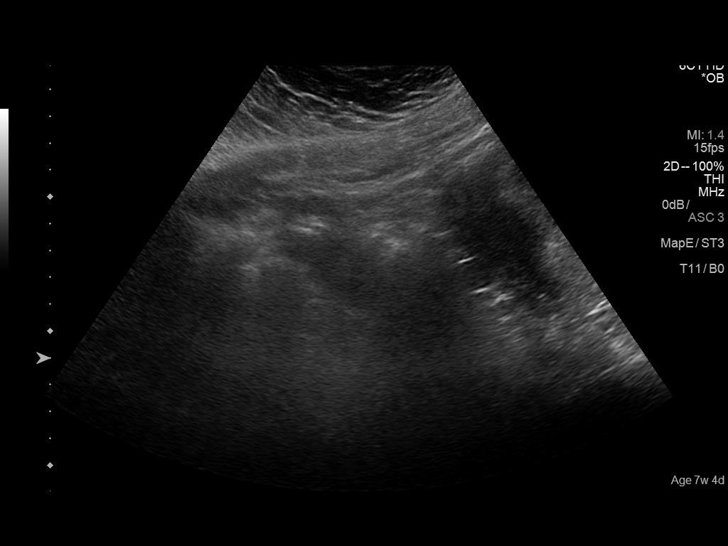
[im 20/59]
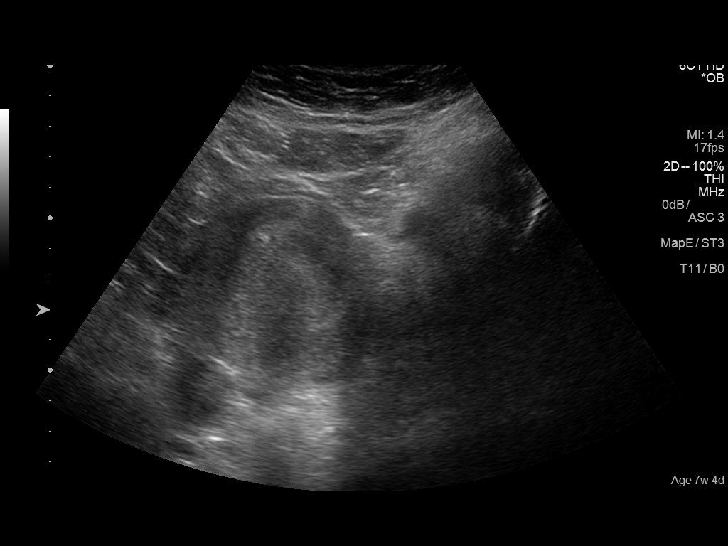
[im 24/59]
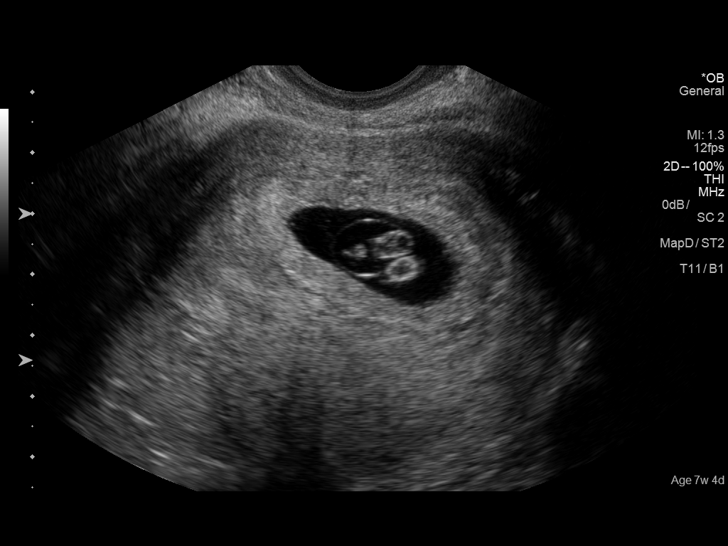
[im 31/59]
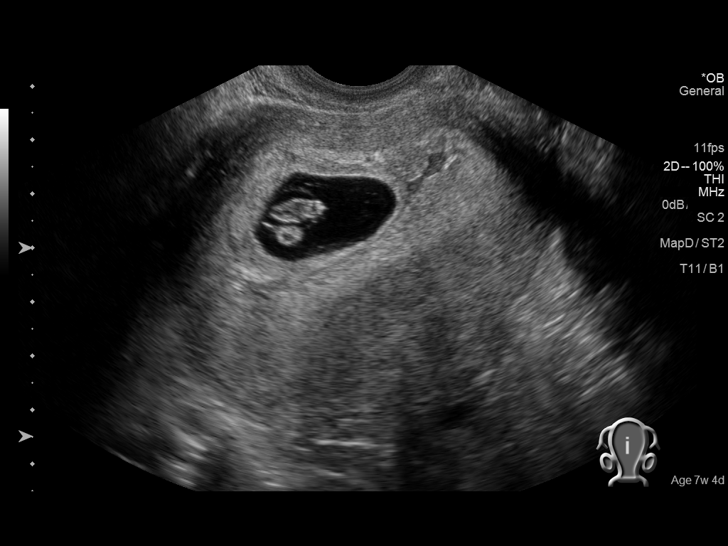
[im 35/59]
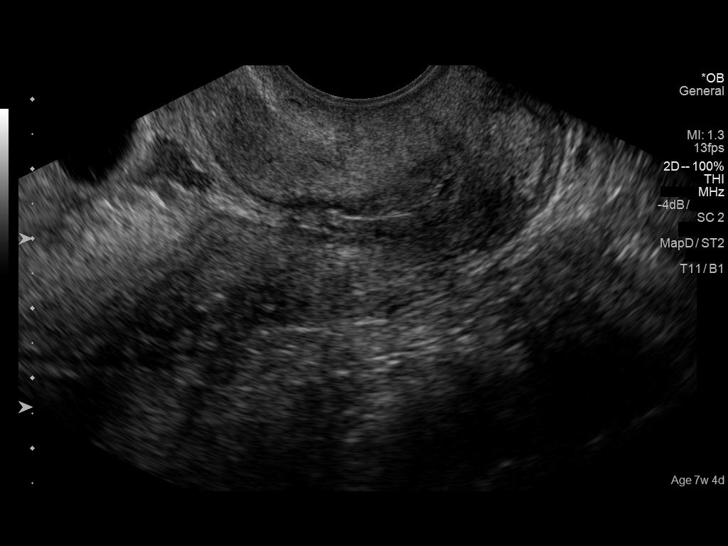
[im 39/59]
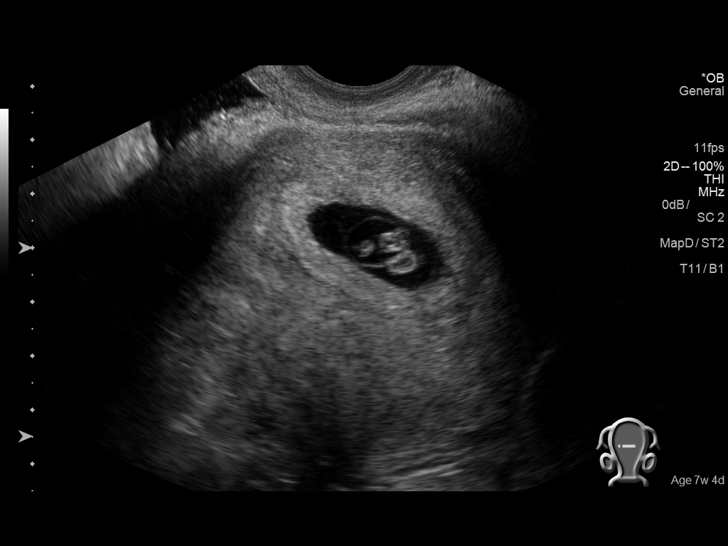
[im 43/59]
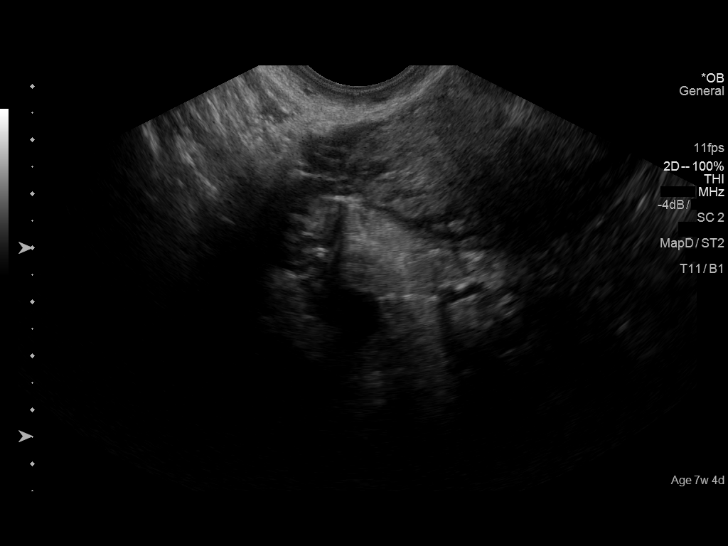
[im 48/59]
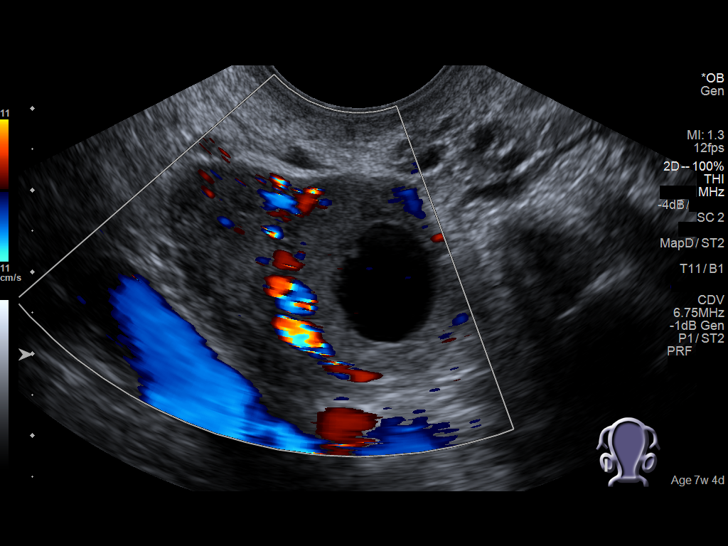
[im 52/59]
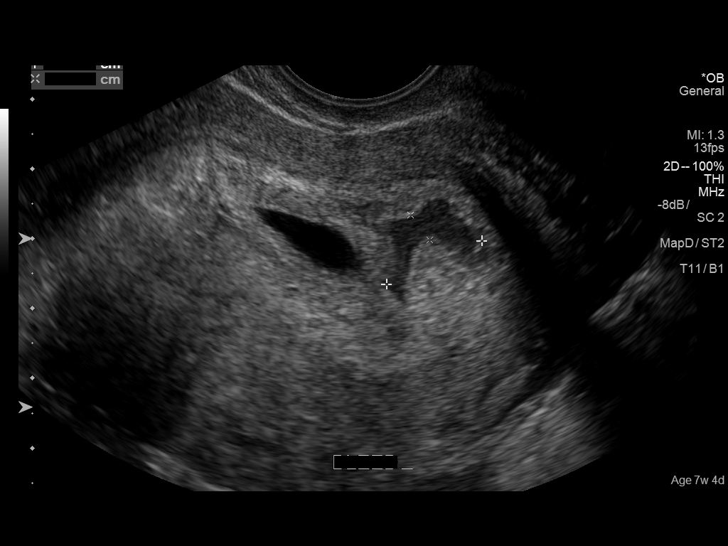
[im 56/59]
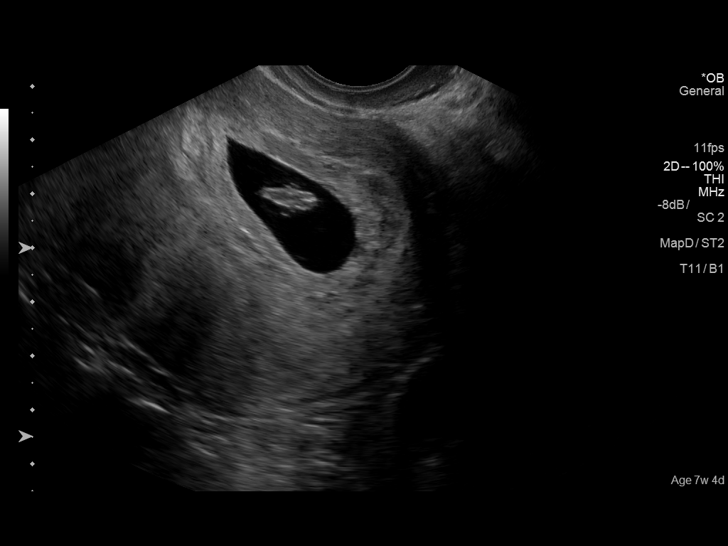

[13 of 28 positions shown; findings below may reference images not displayed]

FINDINGS: Intrauterine gestational sac: Single; visualized and normal in
shape.

Yolk sac:  Yes

Embryo:  Yes

Cardiac Activity: Yes

Heart Rate: 152  bpm

CRL:  1.22 cm   7 w   3 d                  US EDC: 07/06/2016

Subchorionic hemorrhage: A small amount of subchorionic hemorrhage
is noted.

Maternal uterus/adnexae: The uterus is otherwise unremarkable.

The ovaries are within normal limits. The right ovary measures 3.7 x
2.3 x 2.5 cm, while the left ovary measures 3.5 x 1.3 x 3.5 cm. No
suspicious adnexal masses are seen; there is no evidence for ovarian
torsion.

No free fluid is seen within the pelvic cul-de-sac.
IMPRESSION: 1. Single live intrauterine pregnancy noted, with a crown-rump
length of 1.2 cm, corresponding to a gestational age of 7 weeks 3
days. This matches the gestational age of 7 weeks 4 days by LMP,
reflecting an estimated date of delivery July 05, 2016.
2. Small amount of subchorionic hemorrhage noted.

## 2017-07-29 ENCOUNTER — Encounter: Payer: Medicaid Other | Admitting: Obstetrics and Gynecology

## 2017-08-03 ENCOUNTER — Encounter: Payer: Medicaid Other | Admitting: Obstetrics and Gynecology

## 2017-09-11 HISTORY — PX: INDUCED ABORTION: SHX677

## 2017-09-22 ENCOUNTER — Encounter: Payer: Self-pay | Admitting: Obstetrics and Gynecology

## 2017-09-22 ENCOUNTER — Ambulatory Visit: Payer: Medicaid Other | Admitting: Obstetrics and Gynecology

## 2017-09-22 VITALS — BP 109/73 | HR 89 | Ht 68.0 in | Wt 257.3 lb

## 2017-09-22 DIAGNOSIS — Z8742 Personal history of other diseases of the female genital tract: Secondary | ICD-10-CM

## 2017-09-22 DIAGNOSIS — O0380 Unspecified complication following complete or unspecified spontaneous abortion: Secondary | ICD-10-CM

## 2017-09-22 NOTE — Progress Notes (Signed)
Pt stated that she had an abortion on September 11, 2017 and is still bleeding.

## 2017-09-22 NOTE — Progress Notes (Signed)
HPI:      [name] is a 27 y.o. [phone] who LMP was No LMP recorded.  Subjective:   She presents today 2 weeks after having an abortion.  She reports that she has continues to have heavy bleeding  occasionally bleeding through a pad and her close.  She has not contacted the abortion center yet.  She is specifically requesting hysterectomy again.  She has had issues with dysfunctional uterine bleeding and menorrhagia despite previous use of OCPs and IUD.  She has been considering and requesting hysterectomy for several months. Family history of endometriosis and prior hysterectomies at young ages.    Hx: The following portions of the patient's history were reviewed and updated as appropriate:             She  has a past medical history of History of gestational diabetes, History of pre-eclampsia, and Migraines. She does not have any pertinent problems on file. She  has a past surgical history that includes Cholecystectomy. Her family history includes Endometriosis in her maternal aunt and mother; Ovarian cancer in her mother. She  reports that she quit smoking about 23 months ago. She smoked 0.25 packs per day. She has never used smokeless tobacco. She reports that she does not drink alcohol or use drugs. She has a current medication list which includes the following prescription(s): acetaminophen and sertraline. She has No Known Allergies.       Review of Systems:  Review of Systems  Constitutional: Denied constitutional symptoms, night sweats, recent illness, fatigue, fever, insomnia and weight loss.  Eyes: Denied eye symptoms, eye pain, photophobia, vision change and visual disturbance.  Ears/Nose/Throat/Neck: Denied ear, nose, throat or neck symptoms, hearing loss, nasal discharge, sinus congestion and sore throat.  Cardiovascular: Denied cardiovascular symptoms, arrhythmia, chest pain/pressure, edema, exercise intolerance, orthopnea and palpitations.  Respiratory: Denied  pulmonary symptoms, asthma, pleuritic pain, productive sputum, cough, dyspnea and wheezing.  Gastrointestinal: Denied, gastro-esophageal reflux, melena, nausea and vomiting.  Genitourinary: See HPI for additional information.  Musculoskeletal: Denied musculoskeletal symptoms, stiffness, swelling, muscle weakness and myalgia.  Dermatologic: Denied dermatology symptoms, rash and scar.  Neurologic: Denied neurology symptoms, dizziness, headache, neck pain and syncope.  Psychiatric: Denied psychiatric symptoms, anxiety and depression.  Endocrine: Denied endocrine symptoms including hot flashes and night sweats.   Meds:   Current Outpatient Medications on File Prior to Visit  Medication Sig Dispense Refill  . acetaminophen (TYLENOL) 500 MG tablet Take 500 mg by mouth every 6 (six) hours as needed.    . sertraline (ZOLOFT) 50 MG tablet Take 1 tablet (50 mg total) by mouth daily. 30 tablet 1   No current facility-administered medications on file prior to visit.     Objective:     Vitals:   09/22/17 0945  BP: 109/73  Pulse: 89                Assessment:    [id number] Patient Active Problem List   Diagnosis Date Noted  . Gestational diabetes mellitus (GDM) controlled on oral hypoglycemic drug 06/27/2016  . Excessive fetal growth affecting management of mother in third trimester, antepartum 06/25/2016  . Labor and delivery, indication for care 06/24/2016  . Indication for care in labor or delivery 06/17/2016  . Group B Streptococcus carrier, +RV culture, currently pregnant 06/17/2016  . Supervision of high risk elderly multigravida in third trimester 06/17/2016  . Gestational diabetes mellitus (GDM) in third trimester 04/24/2016  . Sleep disturbances 02/20/2016  . Chlamydia infection  affecting pregnancy in first trimester 01/27/2016  . Obesity in pregnancy 12/28/2015  . H/O gestational diabetes in prior pregnancy, currently pregnant 12/28/2015  . H/O pre-eclampsia in prior pregnancy,  currently pregnant 12/28/2015  . Tobacco use disorder, mild, in sustained remission 12/28/2015     1. Post-abortion complication   2. History of dysfunctional uterine bleeding     Patient with heavier than normal bleeding.  Again requesting hysterectomy for a long history of menorrhagia and dysfunctional uterine bleeding   Plan:            1.  I have advised her to contact the abortion center for follow-up regarding her bleeding.  2.  We have discussed the possibility of hysterectomy because of her enlarged uterus, long history of menorrhagia and DU B, and her failure of OCPs and IUD.  She has completed childbearing.  I would like her to be at least 6 weeks post abortion prior to scheduling surgery.  (I believe the most likely cause of her bleeding is adenomyosis based on history uterine size and hormonal failure)  Orders No orders of the defined types were placed in this encounter.   No orders of the defined types were placed in this encounter.     F/U  Return in about 1 month (around 10/20/2017). I spent 17 minutes involved in the care of this patient of which greater than 50% was spent discussing her bleeding status post abortion, her previous history of irregular bleeding, possibility of surgery, all questions answered.  Elonda Huskyavid J. Palmer, M.D. 09/22/2017 10:23 AM

## 2017-10-20 ENCOUNTER — Encounter: Payer: Self-pay | Admitting: Obstetrics and Gynecology

## 2017-10-20 ENCOUNTER — Ambulatory Visit (INDEPENDENT_AMBULATORY_CARE_PROVIDER_SITE_OTHER): Payer: Medicaid Other | Admitting: Obstetrics and Gynecology

## 2017-10-20 VITALS — BP 112/78 | HR 76 | Ht 68.0 in | Wt 258.0 lb

## 2017-10-20 DIAGNOSIS — Z8742 Personal history of other diseases of the female genital tract: Secondary | ICD-10-CM | POA: Diagnosis not present

## 2017-10-20 DIAGNOSIS — Z01818 Encounter for other preprocedural examination: Secondary | ICD-10-CM | POA: Diagnosis not present

## 2017-10-20 MED ORDER — METRONIDAZOLE 500 MG PO TABS: 500.0000 mg | ORAL_TABLET | Freq: Two times a day (BID) | ORAL | 0 refills | Status: DC

## 2017-10-20 MED ORDER — SERTRALINE HCL 50 MG PO TABS: 50.0000 mg | ORAL_TABLET | Freq: Every day | ORAL | 1 refills | Status: DC

## 2017-10-20 NOTE — Progress Notes (Signed)
PRE-OPERATIVE HISTORY AND PHYSICAL EXAM  PCP:  Patient, No Pcp Per Subjective:   HPI:  Marie Palmer is a 27 y.o. [id number].  No LMP recorded.  She presents today for a pre-op discussion and PE.  She has the following symptoms: She has had significant dysfunctional uterine bleeding with multiple periods per month.  She also complains of significant cramping when she does have her menses.  She has tried multiple methods of hormonal control including OCPs and IUD and has failed them.  She recently had an abortion because she was "unable to take birth control".  She is specifically requesting definitive management/hysterectomy.  Review of Systems:   Constitutional: Denied constitutional symptoms, night sweats, recent illness, fatigue, fever, insomnia and weight loss.  Eyes: Denied eye symptoms, eye pain, photophobia, vision change and visual disturbance.  Ears/Nose/Throat/Neck: Denied ear, nose, throat or neck symptoms, hearing loss, nasal discharge, sinus congestion and sore throat.  Cardiovascular: Denied cardiovascular symptoms, arrhythmia, chest pain/pressure, edema, exercise intolerance, orthopnea and palpitations.  Respiratory: Denied pulmonary symptoms, asthma, pleuritic pain, productive sputum, cough, dyspnea and wheezing.  Gastrointestinal: Denied, gastro-esophageal reflux, melena, nausea and vomiting.  Genitourinary: See HPI for additional information.  Musculoskeletal: Denied musculoskeletal symptoms, stiffness, swelling, muscle weakness and myalgia.  Dermatologic: Denied dermatology symptoms, rash and scar.  Neurologic: Denied neurology symptoms, dizziness, headache, neck pain and syncope.  Psychiatric: Denied psychiatric symptoms, anxiety and depression.  Endocrine: Denied endocrine symptoms including hot flashes and night sweats.   OB History  Gravida Para Term Preterm AB Living  3 3 2 1   3   SAB TAB Ectopic Multiple Live Births        0 3    # Outcome Date GA Lbr  Len/2nd Weight Sex Delivery Anes PTL Lv  3 Term 06/28/16 [redacted]w[redacted]d / 00:18 8 lb 4.6 oz (3.76 kg) F Vag-Spont EPI  LIV  2 Term 2014 [redacted]w[redacted]d  6 lb 13 oz (3.09 kg) F Vag-Spont EPI  LIV  1 Preterm 2010 [redacted]w[redacted]d  6 lb 12 oz (3.062 kg) M Vag-Spont EPI  LIV     Complications: Gestational diabetes, Pre-eclampsia    Obstetric Comments  G1- Induction for pre-eclampsia.  Also had GDM.     Past Medical History:  Diagnosis Date  . History of gestational diabetes   . History of pre-eclampsia   . Migraines     Past Surgical History:  Procedure Laterality Date  . CHOLECYSTECTOMY        SOCIAL HISTORY: Social History   Tobacco Use  Smoking Status Former Smoker  . Packs/day: 0.25  . Last attempt to quit: 10/23/2015  . Years since quitting: 1.9  Smokeless Tobacco Never Used  Tobacco Comment   cessation after discovery of pregnancy   Social History   Substance and Sexual Activity  Alcohol Use No   Social History   Substance and Sexual Activity  Drug Use No    Family History  Problem Relation Age of Onset  . Ovarian cancer Mother   . Endometriosis Mother   . Endometriosis Maternal Aunt     ALLERGIES:  Patient has no known allergies.  MEDS:   Current Outpatient Medications on File Prior to Visit  Medication Sig Dispense Refill  . acetaminophen (TYLENOL) 500 MG tablet Take 500 mg by mouth every 6 (six) hours as needed.    . sertraline (ZOLOFT) 50 MG tablet Take 1 tablet (50 mg total) by mouth daily. 30 tablet 1   No current  facility-administered medications on file prior to visit.     No orders of the defined types were placed in this encounter.    Physical examination BP 112/78   Pulse 76   Ht 5\' 8"  (1.727 m)   Wt 258 lb (117 kg)   BMI 39.23 kg/m   General NAD, Conversant  HEENT Atraumatic; Op clear with mmm.  Normo-cephalic. Pupils reactive. Anicteric sclerae  Thyroid/Neck Smooth without nodularity or enlargement. Normal ROM.  Neck Supple.  Skin No rashes, lesions or  ulceration. Normal palpated skin turgor. No nodularity.  Breasts: No masses or discharge.  Symmetric.  No axillary adenopathy.  Lungs: Clear to auscultation.No rales or wheezes. Normal Respiratory effort, no retractions.  Heart: NSR.  No murmurs or rubs appreciated. No periferal edema  Abdomen: Soft.  Non-tender.  No masses.  No HSM. No hernia  Extremities: Moves all appropriately.  Normal ROM for age. No lymphadenopathy.  Neuro: Oriented to PPT.  Normal mood. Normal affect.     Pelvic:   Vulva: Normal appearance.  No lesions.  Vagina: No lesions or abnormalities noted.  Support: Normal pelvic support.  Urethra No masses tenderness or scarring.  Meatus Normal size without lesions or prolapse.  Cervix: Normal ectropion.  No lesions.  Anus: Normal exam.  No lesions.  Perineum: Normal exam.  No lesions.        Bimanual   Uterus:  Top normal size non-tender.  Mobile.  AV.  Adnexae: No masses.  Non-tender to palpation.  Cul-de-sac: Negative for abnormality.   Assessment:   [id number] Patient Active Problem List   Diagnosis Date Noted  . Gestational diabetes mellitus (GDM) controlled on oral hypoglycemic drug 06/27/2016  . Excessive fetal growth affecting management of mother in third trimester, antepartum 06/25/2016  . Labor and delivery, indication for care 06/24/2016  . Indication for care in labor or delivery 06/17/2016  . Group B Streptococcus carrier, +RV culture, currently pregnant 06/17/2016  . Supervision of high risk elderly multigravida in third trimester 06/17/2016  . Gestational diabetes mellitus (GDM) in third trimester 04/24/2016  . Sleep disturbances 02/20/2016  . Chlamydia infection affecting pregnancy in first trimester 01/27/2016  . Obesity in pregnancy 12/28/2015  . H/O gestational diabetes in prior pregnancy, currently pregnant 12/28/2015  . H/O pre-eclampsia in prior pregnancy, currently pregnant 12/28/2015  . Tobacco use disorder, mild, in sustained remission  12/28/2015    1. Preop examination   2. History of dysfunctional uterine bleeding      Her exam and dysfunctional bleeding likely consistent with adenomyosis.  Plan:   Orders: No orders of the defined types were placed in this encounter.    1.  LAVH   Pre-op discussions regarding Risks and Benefits of her scheduled surgery.  LAVH The procedure of Laparoscopic Assisted Vaginal Hysterectomy was described to the patient in detail.  We reviewed the rationale for Hysterectomy and the patient was again informed of other nonsurgical management possibilities for her condition.  She has considered these other options, and desires a Hysterectomy.  We have reviewed the fact that Hysterectomy is permanent and that following the procedure she will not be able to become pregnant or bear children.  We have discussed the following risk factors specifically and the patient has also been informed that additional complications not mentioned may develop:  Damage to bowel, bladder, ureters or to other internal organs, bleeding, infection and the risk from anesthesia.  We have discussed the procedure itself in detail and she has an informed understanding  of this surgery.  We have also discussed the recovery period in which physical and sexual activity will be restricted for a varying degree of time, often 3 - 6 weeks. The Laparoscopic Portion of Hysterectomy has also been reviewed with the patient.  She understands how the laparoscope facilitates the procedure.  We have discussed the abdominal incisions and punctures that will be used.  We have also reviewed the increased Operating Room time often accompanying LAVH.  The slightly increased risk of complications secondary to abdominal punctures, and use of laparoscopic instrumentation has also been discussed in detail.I have answered all of her questions and I believe the patient has an informed understanding of the procedure of Laparoscopic Assisted Vaginal  Hysterectomy.  Oophorectomy The option of Oophorectomy has been discussed with the patient.  Detailed risk/benefits have been reviewed.  The risks discussed include, but are not limited to, hemorrhage, infection, damage to ureter or other internal organ, and Ovarian Remnant Syndrome.  The benefits include a significant decrease in the risk of Ovarian Cancer and in benign Ovarian disease.  The risk of Ovarian CA has been estimated at 1 in 70.  This is a relatively small risk.  However, should Ovarian CA develop, it is often found late in the course of the disease.  We have also discussed the role of inheritance in the development of Ovarian disease.  Some women, who have close relatives with Ovarian CA, have a higher than 1 in 70 risk of Ovarian CA.  The benefits of Estrogen replacement therapy following Oophorectomy has been stressed.  If she is premenopausal, we have discussed the fact that this procedure will make her permanently sterile and that premature menopause will result if no ERT is begun.  I have answered all of her questions, and I believe that she has an adequate and informed understanding of the risks and benefits of Oophorectomy.  She has elected to keep her ovaries.    [name], M.D. 10/20/2017 10:24 AM

## 2017-10-20 NOTE — H&P (Signed)
PRE-OPERATIVE HISTORY AND PHYSICAL EXAM  PCP:  Patient, No Pcp Per Subjective:   HPI:  Marie Palmer is a 27 y.o. [id number].  No LMP recorded.  She presents today for a pre-op discussion and PE.  She has the following symptoms: She has had significant dysfunctional uterine bleeding with multiple periods per month.  She also complains of significant cramping when she does have her menses.  She has tried multiple methods of hormonal control including OCPs and IUD and has failed them.  She recently had an abortion because she was "unable to take birth control".  She is specifically requesting definitive management/hysterectomy.  Review of Systems:   Constitutional: Denied constitutional symptoms, night sweats, recent illness, fatigue, fever, insomnia and weight loss.  Eyes: Denied eye symptoms, eye pain, photophobia, vision change and visual disturbance.  Ears/Nose/Throat/Neck: Denied ear, nose, throat or neck symptoms, hearing loss, nasal discharge, sinus congestion and sore throat.  Cardiovascular: Denied cardiovascular symptoms, arrhythmia, chest pain/pressure, edema, exercise intolerance, orthopnea and palpitations.  Respiratory: Denied pulmonary symptoms, asthma, pleuritic pain, productive sputum, cough, dyspnea and wheezing.  Gastrointestinal: Denied, gastro-esophageal reflux, melena, nausea and vomiting.  Genitourinary: See HPI for additional information.  Musculoskeletal: Denied musculoskeletal symptoms, stiffness, swelling, muscle weakness and myalgia.  Dermatologic: Denied dermatology symptoms, rash and scar.  Neurologic: Denied neurology symptoms, dizziness, headache, neck pain and syncope.  Psychiatric: Denied psychiatric symptoms, anxiety and depression.  Endocrine: Denied endocrine symptoms including hot flashes and night sweats.                   OB History  Gravida Para Term Preterm AB Living  3 3 2 1   3   SAB TAB  Ectopic Multiple Live Births           0 3       # Outcome Date GA Lbr Len/2nd Weight Sex Delivery Anes PTL Lv  3 Term 06/28/16 [redacted]w[redacted]d / 00:18 8 lb 4.6 oz (3.76 kg) F Vag-Spont EPI  LIV  2 Term 2014 [redacted]w[redacted]d  6 lb 13 oz (3.09 kg) F Vag-Spont EPI  LIV  1 Preterm 2010 [redacted]w[redacted]d  6 lb 12 oz (3.062 kg) M Vag-Spont EPI  LIV     Complications: Gestational diabetes, Pre-eclampsia    Obstetric Comments  G1- Induction for pre-eclampsia.  Also had GDM.         Past Medical History:  Diagnosis Date  . History of gestational diabetes   . History of pre-eclampsia   . Migraines          Past Surgical History:  Procedure Laterality Date  . CHOLECYSTECTOMY        SOCIAL HISTORY: Social History       Tobacco Use  Smoking Status Former Smoker  . Packs/day: 0.25  . Last attempt to quit: 10/23/2015  . Years since quitting: 1.9  Smokeless Tobacco Never Used  Tobacco Comment   cessation after discovery of pregnancy   Social History       Substance and Sexual Activity   Alcohol Use No    Social History      Substance and Sexual Activity  Drug Use No         Family History  Problem Relation  Age of Onset  . Ovarian cancer Mother   . Endometriosis Mother   . Endometriosis Maternal Aunt     ALLERGIES:  Patient has no known allergies.  MEDS:                    Current Outpatient Medications on File Prior to Visit  Medication Sig Dispense Refill  . acetaminophen (TYLENOL) 500 MG tablet Take 500 mg by mouth every 6 (six) hours as needed.    . sertraline (ZOLOFT) 50 MG tablet Take 1 tablet (50 mg total) by mouth daily. 30 tablet 1   No current facility-administered medications on file prior to visit.     No orders of the defined types were placed in this encounter.    Physical examination BP 112/78   Pulse 76   Ht 5\' 8"  (1.727 m)   Wt 258 lb (117 kg)   BMI 39.23 kg/m   General NAD, Conversant  HEENT Atraumatic; Op clear with mmm.   Normo-cephalic. Pupils reactive. Anicteric sclerae  Thyroid/Neck Smooth without nodularity or enlargement. Normal ROM.  Neck Supple.  Skin No rashes, lesions or ulceration. Normal palpated skin turgor. No nodularity.  Breasts: No masses or discharge.  Symmetric.  No axillary adenopathy.  Lungs: Clear to auscultation.No rales or wheezes. Normal Respiratory effort, no retractions.  Heart: NSR.  No murmurs or rubs appreciated. No periferal edema  Abdomen: Soft.  Non-tender.  No masses.  No HSM. No hernia  Extremities: Moves all appropriately.  Normal ROM for age. No lymphadenopathy.  Neuro: Oriented to PPT.  Normal mood. Normal affect.             Pelvic:   Vulva: Normal appearance.  No lesions.   Vagina: No lesions or abnormalities noted.   Support: Normal pelvic support.   Urethra No masses tenderness or scarring.   Meatus Normal size without lesions or prolapse.   Cervix: Normal ectropion.  No lesions.   Anus: Normal exam.  No lesions.   Perineum: Normal exam.  No lesions.         Bimanual   Uterus:  Top normal size non-tender.  Mobile.  AV.    Adnexae: No masses.  Non-tender to palpation.    Cul-de-sac: Negative for abnormality.     Assessment:   [id number]     Patient Active Problem List   Diagnosis Date Noted  . Gestational diabetes mellitus (GDM) controlled on oral hypoglycemic drug 06/27/2016  . Excessive fetal growth affecting management of mother in third trimester, antepartum 06/25/2016  . Labor and delivery, indication for care 06/24/2016  . Indication for care in labor or delivery 06/17/2016  . Group B Streptococcus carrier, +RV culture, currently pregnant 06/17/2016  . Supervision of high risk elderly multigravida in third trimester 06/17/2016  . Gestational diabetes mellitus (GDM) in third trimester 04/24/2016  . Sleep disturbances 02/20/2016  . Chlamydia infection affecting pregnancy in first trimester 01/27/2016  . Obesity in pregnancy 12/28/2015  . H/O  gestational diabetes in prior pregnancy, currently pregnant 12/28/2015  . H/O pre-eclampsia in prior pregnancy, currently pregnant 12/28/2015  . Tobacco use disorder, mild, in sustained remission 12/28/2015    1. Preop examination   2. History of dysfunctional uterine bleeding                Her exam and dysfunctional bleeding likely consistent with adenomyosis.  Plan:   Orders: No orders of the defined types were placed in this encounter.    1.  LAVH   Pre-op discussions regarding Risks and Benefits of her scheduled surgery.  LAVH The procedure of Laparoscopic Assisted Vaginal Hysterectomy was described to the patient in detail.  We reviewed the rationale for Hysterectomy and the patient was again informed of other nonsurgical management possibilities for her condition.  She has considered these other options, and desires a Hysterectomy.  We have reviewed the fact that Hysterectomy is permanent and that following the procedure she will not be able to become pregnant or bear children.  We have discussed the following risk factors specifically and the patient has also been informed that additional complications not mentioned may develop:  Damage to bowel, bladder, ureters or to other internal organs, bleeding, infection and the risk from anesthesia.  We have discussed the procedure itself in detail and she has an informed understanding of this surgery.  We have also discussed the recovery period in which physical and sexual activity will be restricted for a varying degree of time, often 3 - 6 weeks. The Laparoscopic Portion of Hysterectomy has also been reviewed with the patient.  She understands how the laparoscope facilitates the procedure.  We have discussed the abdominal incisions and punctures that will be used.  We have also reviewed the increased Operating Room time often accompanying LAVH.  The slightly increased risk of complications secondary to abdominal punctures, and use  of laparoscopic instrumentation has also been discussed in detail.I have answered all of her questions and I believe the patient has an informed understanding of the procedure of Laparoscopic Assisted Vaginal Hysterectomy.  Oophorectomy The option of Oophorectomy has been discussed with the patient.  Detailed risk/benefits have been reviewed.  The risks discussed include, but are not limited to, hemorrhage, infection, damage to ureter or other internal organ, and Ovarian Remnant Syndrome.  The benefits include a significant decrease in the risk of Ovarian Cancer and in benign Ovarian disease.  The risk of Ovarian CA has been estimated at 1 in 70.  This is a relatively small risk.  However, should Ovarian CA develop, it is often found late in the course of the disease.  We have also discussed the role of inheritance in the development of Ovarian disease.  Some women, who have close relatives with Ovarian CA, have a higher than 1 in 70 risk of Ovarian CA.  The benefits of Estrogen replacement therapy following Oophorectomy has been stressed.  If she is premenopausal, we have discussed the fact that this procedure will make her permanently sterile and that premature menopause will result if no ERT is begun.  I have answered all of her questions, and I believe that she has an adequate and informed understanding of the risks and benefits of Oophorectomy.  She has elected to keep her ovaries.    Elonda Huskyavid J. Kishia Shackett, M.D. 10/20/2017 10:24 AM

## 2017-10-20 NOTE — Progress Notes (Signed)
Pt states no issues at this time. Pt also wants a refill of sertraline, which she has not had since April.

## 2017-10-20 NOTE — Addendum Note (Signed)
Addended by: Elonda HuskyEVANS, DAVID J on: 10/20/2017 10:33 AM   Modules accepted: Orders

## 2017-11-13 ENCOUNTER — Encounter: Payer: Self-pay | Admitting: Obstetrics and Gynecology

## 2017-11-19 ENCOUNTER — Other Ambulatory Visit: Payer: Self-pay

## 2017-11-19 ENCOUNTER — Encounter
Admission: RE | Admit: 2017-11-19 | Discharge: 2017-11-19 | Disposition: A | Discharge status: Home / Self Care | Payer: Medicaid Other | Source: Ambulatory Visit | Attending: Obstetrics and Gynecology | Admitting: Obstetrics and Gynecology

## 2017-11-19 HISTORY — DX: Anxiety disorder, unspecified: F41.9

## 2017-11-19 NOTE — Patient Instructions (Signed)
Your procedure is scheduled on: 11-23-17 MONDAY Report to Same Day Surgery 2nd floor medical mall [facility] Entrance-take elevator on left to 2nd floor.  Check in with surgery information desk.) To find out your arrival time please call [phone] between 1PM - 3PM on 11-20-17 FRIDAY  Remember: Instructions that are not followed completely may result in serious medical risk, up to and including death, or upon the discretion of your surgeon and anesthesiologist your surgery may need to be rescheduled.    _x___ 1. Do not eat food after midnight the night before your procedure. You may drink clear liquids up to 2 hours before you are scheduled to arrive at the hospital for your procedure.  Do not drink clear liquids within 2 hours of your scheduled arrival to the hospital.  Clear liquids include  --Water or Apple juice without pulp  --Clear carbohydrate beverage such as ClearFast or Gatorade  --Black Coffee or Clear Tea (No milk, no creamers, do not add anything to the coffee or Tea   ____Ensure clear carbohydrate drink on the way to the hospital for bariatric patients  ____Ensure clear carbohydrate drink 3 hours before surgery for Dr Rutherford Nail patients if physician instructed.    __x__ 2. No Alcohol for 24 hours before or after surgery.   __x__3. No Smoking or e-cigarettes for 24 prior to surgery.  Do not use any chewable tobacco products for at least 6 hour prior to surgery   ____  4. Bring all medications with you on the day of surgery if instructed.    __x__ 5. Notify your doctor if there is any change in your medical condition     (cold, fever, infections).    x___6. On the morning of surgery brush your teeth with toothpaste and water.  You may rinse your mouth with mouth wash if you wish.  Do not swallow any toothpaste or mouthwash.   Do not wear jewelry, make-up, hairpins, clips or nail polish.  Do not wear lotions, powders, or perfumes. You may wear deodorant.  Do not shave  48 hours prior to surgery. Men may shave face and neck.  Do not bring valuables to the hospital.    [facility] is not responsible for any belongings or valuables.               Contacts, dentures or bridgework may not be worn into surgery.  Leave your suitcase in the car. After surgery it may be brought to your room.  For patients admitted to the hospital, discharge time is determined by your treatment team.  _  Patients discharged the day of surgery will not be allowed to drive home.  You will need someone to drive you home and stay with you the night of your procedure.    Please read over the following fact sheets that you were given:   [facility] Preparing for Surgery  ____ Take anti-hypertensive listed below, cardiac, seizure, asthma, anti-reflux and psychiatric medicines. These include:  1. NONE  2.  3.  4.  5.  6.  ____Fleets enema or Magnesium Citrate as directed.   _x___ Use CHG Soap or sage wipes as directed on instruction sheet   ____ Use inhalers on the day of surgery and bring to hospital day of surgery  ____ Stop Metformin and Janumet 2 days prior to surgery.    ____ Take 1/2 of usual insulin dose the night before surgery and none on the morning surgery.   ____ Follow recommendations  from Cardiologist, Pulmonologist or PCP regarding stopping Aspirin, Coumadin, Plavix ,Eliquis, Effient, or Pradaxa, and Pletal.  X____Stop Anti-inflammatories such as Advil, Aleve, Ibuprofen, Motrin, Naproxen, Naprosyn, Goodies powders, EXCEDRIN MIGRAINE or aspirin products NOW-OK to take Tylenol    _x___ Stop supplements until after surgery-STOP FISH OIL NOW-MAY RESUME AFTER SURGERY   ____ Bring C-Pap to the hospital.

## 2017-11-20 ENCOUNTER — Encounter
Admission: RE | Admit: 2017-11-20 | Discharge: 2017-11-20 | Disposition: A | Discharge status: Home / Self Care | Payer: Medicaid Other | Source: Ambulatory Visit | Attending: Obstetrics and Gynecology | Admitting: Obstetrics and Gynecology

## 2017-11-20 DIAGNOSIS — Z01812 Encounter for preprocedural laboratory examination: Secondary | ICD-10-CM | POA: Diagnosis not present

## 2017-11-20 LAB — BASIC METABOLIC PANEL
Anion gap: 7 (ref 5–15)
BUN: 10 mg/dL (ref 6–20)
CALCIUM: 8.7 mg/dL — AB (ref 8.9–10.3)
CO2: 25 mmol/L (ref 22–32)
CREATININE: 0.8 mg/dL (ref 0.44–1.00)
Chloride: 108 mmol/L (ref 98–111)
GFR calc Af Amer: >60 mL/min (ref >60)
GLUCOSE: 101 mg/dL — AB (ref 70–99)
Potassium: 4.4 mmol/L (ref 3.5–5.1)
Sodium: 140 mmol/L (ref 135–145)

## 2017-11-20 LAB — TYPE AND SCREEN
ABO/RH(D): A POS
ANTIBODY SCREEN: NEGATIVE
EXTEND SAMPLE REASON: UNDETERMINED

## 2017-11-20 LAB — CBC
HCT: 38.9 % (ref 35.0–47.0)
HEMOGLOBIN: 13.3 g/dL (ref 12.0–16.0)
MCH: 28.2 pg (ref 26.0–34.0)
MCHC: 34.3 g/dL (ref 32.0–36.0)
MCV: 82.2 fL (ref 80.0–100.0)
PLATELETS: 305 10*3/uL (ref 150–440)
RBC: 4.74 MIL/uL (ref 3.80–5.20)
RDW: 14.4 % (ref 11.5–14.5)
WBC: 8.8 10*3/uL (ref 3.6–11.0)

## 2017-11-20 LAB — HCG, QUANTITATIVE, PREGNANCY

## 2017-11-22 MED ORDER — CEFAZOLIN SODIUM-DEXTROSE 2-4 GM/100ML-% IV SOLN
2.0000 g | INTRAVENOUS | Status: AC
  Administered 2017-11-23: 2 g via INTRAVENOUS

## 2017-11-23 ENCOUNTER — Encounter
Admission: RE | Disposition: A | Discharge status: Home / Self Care | Payer: Self-pay | Source: Ambulatory Visit | Attending: Obstetrics and Gynecology

## 2017-11-23 ENCOUNTER — Observation Stay
Admission: RE | Admit: 2017-11-23 | Discharge: 2017-11-24 | Disposition: A | Discharge status: Home / Self Care | Payer: Medicaid Other | Source: Ambulatory Visit | Attending: Obstetrics and Gynecology | Admitting: Obstetrics and Gynecology

## 2017-11-23 ENCOUNTER — Ambulatory Visit: Payer: Medicaid Other | Admitting: Anesthesiology

## 2017-11-23 ENCOUNTER — Other Ambulatory Visit: Payer: Self-pay

## 2017-11-23 ENCOUNTER — Encounter: Payer: Self-pay | Admitting: *Deleted

## 2017-11-23 DIAGNOSIS — N800 Endometriosis of uterus: Secondary | ICD-10-CM | POA: Diagnosis not present

## 2017-11-23 DIAGNOSIS — I10 Essential (primary) hypertension: Secondary | ICD-10-CM | POA: Insufficient documentation

## 2017-11-23 DIAGNOSIS — N711 Chronic inflammatory disease of uterus: Secondary | ICD-10-CM | POA: Diagnosis not present

## 2017-11-23 DIAGNOSIS — Z87891 Personal history of nicotine dependence: Secondary | ICD-10-CM | POA: Insufficient documentation

## 2017-11-23 DIAGNOSIS — N938 Other specified abnormal uterine and vaginal bleeding: Principal | ICD-10-CM | POA: Diagnosis present

## 2017-11-23 DIAGNOSIS — F419 Anxiety disorder, unspecified: Secondary | ICD-10-CM | POA: Insufficient documentation

## 2017-11-23 HISTORY — PX: LAPAROSCOPIC ASSISTED VAGINAL HYSTERECTOMY: SHX5398

## 2017-11-23 HISTORY — PX: BILATERAL SALPINGECTOMY: SHX5743

## 2017-11-23 LAB — URINE DRUG SCREEN, QUALITATIVE (ARMC ONLY)
Amphetamines, Ur Screen: NOT DETECTED
BARBITURATES, UR SCREEN: NOT DETECTED
Benzodiazepine, Ur Scrn: NOT DETECTED
Cannabinoid 50 Ng, Ur ~~LOC~~: NOT DETECTED
Cocaine Metabolite,Ur ~~LOC~~: NOT DETECTED
MDMA (ECSTASY) UR SCREEN: NOT DETECTED
METHADONE SCREEN, URINE: NOT DETECTED
Opiate, Ur Screen: NOT DETECTED
Phencyclidine (PCP) Ur S: NOT DETECTED
TRICYCLIC, UR SCREEN: NOT DETECTED

## 2017-11-23 SURGERY — HYSTERECTOMY, VAGINAL, LAPAROSCOPY-ASSISTED
Anesthesia: General | Site: Abdomen | Wound class: Clean Contaminated

## 2017-11-23 MED ORDER — FAMOTIDINE 20 MG PO TABS
ORAL_TABLET | ORAL | Status: AC
  Filled 2017-11-23: qty 1

## 2017-11-23 MED ORDER — OXYCODONE HCL 5 MG/5ML PO SOLN: 5.0000 mg | Freq: Once | ORAL | Status: AC | PRN

## 2017-11-23 MED ORDER — LACTATED RINGERS IV SOLN
INTRAVENOUS | Status: DC
  Administered 2017-11-23: 09:00:00 via INTRAVENOUS

## 2017-11-23 MED ORDER — MIDAZOLAM HCL 2 MG/2ML IJ SOLN
INTRAMUSCULAR | Status: DC | PRN
  Administered 2017-11-23: 2 mg via INTRAVENOUS

## 2017-11-23 MED ORDER — ROCURONIUM BROMIDE 100 MG/10ML IV SOLN
INTRAVENOUS | Status: DC | PRN
  Administered 2017-11-23: 50 mg via INTRAVENOUS
  Administered 2017-11-23 (×2): 20 mg via INTRAVENOUS

## 2017-11-23 MED ORDER — DEXMEDETOMIDINE HCL 200 MCG/2ML IV SOLN
INTRAVENOUS | Status: DC | PRN
  Administered 2017-11-23: 12 ug via INTRAVENOUS

## 2017-11-23 MED ORDER — ACETAMINOPHEN 10 MG/ML IV SOLN
INTRAVENOUS | Status: DC | PRN
  Administered 2017-11-23: 1000 mg via INTRAVENOUS

## 2017-11-23 MED ORDER — SODIUM CHLORIDE 0.9 % IJ SOLN
INTRAMUSCULAR | Status: AC
  Filled 2017-11-23: qty 50

## 2017-11-23 MED ORDER — DEXTROSE IN LACTATED RINGERS 5 % IV SOLN: INTRAVENOUS | Status: DC

## 2017-11-23 MED ORDER — BUPIVACAINE HCL (PF) 0.5 % IJ SOLN
INTRAMUSCULAR | Status: AC
  Filled 2017-11-23: qty 30

## 2017-11-23 MED ORDER — OXYCODONE-ACETAMINOPHEN 5-325 MG PO TABS
1.0000 | ORAL_TABLET | ORAL | Status: DC | PRN
  Administered 2017-11-23 (×2): 2 via ORAL
  Administered 2017-11-24: 1 via ORAL
  Administered 2017-11-24: 2 via ORAL
  Filled 2017-11-23 (×2): qty 2
  Filled 2017-11-23: qty 1
  Filled 2017-11-23: qty 2

## 2017-11-23 MED ORDER — PROMETHAZINE HCL 25 MG/ML IJ SOLN: 6.2500 mg | INTRAMUSCULAR | Status: DC | PRN

## 2017-11-23 MED ORDER — LIDOCAINE HCL (PF) 2 % IJ SOLN
INTRAMUSCULAR | Status: AC
  Filled 2017-11-23: qty 10

## 2017-11-23 MED ORDER — LIDOCAINE HCL (CARDIAC) PF 100 MG/5ML IV SOSY
PREFILLED_SYRINGE | INTRAVENOUS | Status: DC | PRN
  Administered 2017-11-23: 50 mg via INTRAVENOUS

## 2017-11-23 MED ORDER — SUGAMMADEX SODIUM 500 MG/5ML IV SOLN
INTRAVENOUS | Status: AC
  Filled 2017-11-23: qty 5

## 2017-11-23 MED ORDER — SIMETHICONE 80 MG PO CHEW
80.0000 mg | CHEWABLE_TABLET | Freq: Four times a day (QID) | ORAL | Status: DC | PRN
  Administered 2017-11-23 – 2017-11-24 (×3): 80 mg via ORAL
  Filled 2017-11-23 (×3): qty 1

## 2017-11-23 MED ORDER — CEFAZOLIN SODIUM-DEXTROSE 2-4 GM/100ML-% IV SOLN
INTRAVENOUS | Status: AC
  Filled 2017-11-23: qty 100

## 2017-11-23 MED ORDER — ROCURONIUM BROMIDE 50 MG/5ML IV SOLN
INTRAVENOUS | Status: AC
  Filled 2017-11-23: qty 1

## 2017-11-23 MED ORDER — KETOROLAC TROMETHAMINE 30 MG/ML IJ SOLN
30.0000 mg | Freq: Four times a day (QID) | INTRAMUSCULAR | Status: DC
  Administered 2017-11-24: 30 mg via INTRAVENOUS
  Filled 2017-11-23: qty 1

## 2017-11-23 MED ORDER — KETOROLAC TROMETHAMINE 30 MG/ML IJ SOLN
INTRAMUSCULAR | Status: AC
  Filled 2017-11-23: qty 1

## 2017-11-23 MED ORDER — FENTANYL CITRATE (PF) 100 MCG/2ML IJ SOLN
INTRAMUSCULAR | Status: AC
  Filled 2017-11-23: qty 2

## 2017-11-23 MED ORDER — VASOPRESSIN 20 UNIT/ML IV SOLN
INTRAVENOUS | Status: AC
  Filled 2017-11-23: qty 1

## 2017-11-23 MED ORDER — HYDROMORPHONE HCL 1 MG/ML IJ SOLN
INTRAMUSCULAR | Status: AC
  Filled 2017-11-23: qty 1

## 2017-11-23 MED ORDER — KETOROLAC TROMETHAMINE 30 MG/ML IJ SOLN: 30.0000 mg | Freq: Four times a day (QID) | INTRAMUSCULAR | Status: DC

## 2017-11-23 MED ORDER — OXYCODONE HCL 5 MG PO TABS
5.0000 mg | ORAL_TABLET | Freq: Once | ORAL | Status: AC | PRN
  Administered 2017-11-23: 5 mg via ORAL

## 2017-11-23 MED ORDER — PROPOFOL 10 MG/ML IV BOLUS
INTRAVENOUS | Status: AC
  Filled 2017-11-23: qty 20

## 2017-11-23 MED ORDER — MIDAZOLAM HCL 2 MG/2ML IJ SOLN
INTRAMUSCULAR | Status: AC
  Filled 2017-11-23: qty 2

## 2017-11-23 MED ORDER — DEXAMETHASONE SODIUM PHOSPHATE 10 MG/ML IJ SOLN
INTRAMUSCULAR | Status: DC | PRN
  Administered 2017-11-23: 5 mg via INTRAVENOUS

## 2017-11-23 MED ORDER — DEXAMETHASONE SODIUM PHOSPHATE 10 MG/ML IJ SOLN
INTRAMUSCULAR | Status: AC
  Filled 2017-11-23: qty 1

## 2017-11-23 MED ORDER — FENTANYL CITRATE (PF) 100 MCG/2ML IJ SOLN
INTRAMUSCULAR | Status: AC
  Administered 2017-11-23: 25 ug via INTRAVENOUS
  Filled 2017-11-23: qty 2

## 2017-11-23 MED ORDER — ACETAMINOPHEN NICU IV SYRINGE 10 MG/ML
INTRAVENOUS | Status: AC
  Filled 2017-11-23: qty 1

## 2017-11-23 MED ORDER — OXYCODONE HCL 5 MG PO TABS
ORAL_TABLET | ORAL | Status: AC
  Filled 2017-11-23: qty 1

## 2017-11-23 MED ORDER — ONDANSETRON HCL 4 MG/2ML IJ SOLN
INTRAMUSCULAR | Status: AC
  Filled 2017-11-23: qty 2

## 2017-11-23 MED ORDER — FAMOTIDINE 20 MG PO TABS
20.0000 mg | ORAL_TABLET | Freq: Once | ORAL | Status: AC
  Administered 2017-11-23: 20 mg via ORAL

## 2017-11-23 MED ORDER — FENTANYL CITRATE (PF) 100 MCG/2ML IJ SOLN
25.0000 ug | INTRAMUSCULAR | Status: AC | PRN
  Administered 2017-11-23 (×6): 25 ug via INTRAVENOUS

## 2017-11-23 MED ORDER — VASOPRESSIN 20 UNIT/ML IV SOLN
INTRAVENOUS | Status: DC | PRN
  Administered 2017-11-23: 8 mL via INTRAMUSCULAR

## 2017-11-23 MED ORDER — HYDROMORPHONE HCL 1 MG/ML IJ SOLN: 0.2000 mg | INTRAMUSCULAR | Status: DC | PRN

## 2017-11-23 MED ORDER — IBUPROFEN 600 MG PO TABS
600.0000 mg | ORAL_TABLET | Freq: Four times a day (QID) | ORAL | Status: DC | PRN
  Administered 2017-11-23 – 2017-11-24 (×2): 600 mg via ORAL
  Filled 2017-11-23 (×3): qty 1

## 2017-11-23 MED ORDER — BUPIVACAINE HCL 0.5 % IJ SOLN
INTRAMUSCULAR | Status: DC | PRN
  Administered 2017-11-23: 15 mL

## 2017-11-23 MED ORDER — PROPOFOL 10 MG/ML IV BOLUS
INTRAVENOUS | Status: DC | PRN
  Administered 2017-11-23: 150 mg via INTRAVENOUS
  Administered 2017-11-23: 50 mg via INTRAVENOUS

## 2017-11-23 MED ORDER — DEXMEDETOMIDINE HCL IN NACL 80 MCG/20ML IV SOLN
INTRAVENOUS | Status: AC
  Filled 2017-11-23: qty 20

## 2017-11-23 MED ORDER — FENTANYL CITRATE (PF) 100 MCG/2ML IJ SOLN
INTRAMUSCULAR | Status: DC | PRN
  Administered 2017-11-23: 50 ug via INTRAVENOUS
  Administered 2017-11-23: 100 ug via INTRAVENOUS
  Administered 2017-11-23: 50 ug via INTRAVENOUS

## 2017-11-23 MED ORDER — ONDANSETRON HCL 4 MG/2ML IJ SOLN
INTRAMUSCULAR | Status: DC | PRN
  Administered 2017-11-23: 4 mg via INTRAVENOUS

## 2017-11-23 MED ORDER — SUGAMMADEX SODIUM 500 MG/5ML IV SOLN
INTRAVENOUS | Status: DC | PRN
  Administered 2017-11-23: 240 mg via INTRAVENOUS

## 2017-11-23 MED ORDER — KETOROLAC TROMETHAMINE 30 MG/ML IJ SOLN
30.0000 mg | Freq: Once | INTRAMUSCULAR | Status: AC
  Administered 2017-11-23: 30 mg via INTRAVENOUS

## 2017-11-23 MED ORDER — HYDROMORPHONE HCL 1 MG/ML IJ SOLN
INTRAMUSCULAR | Status: DC | PRN
  Administered 2017-11-23: 0.5 mg via INTRAVENOUS

## 2017-11-23 SURGICAL SUPPLY — 49 items
ADHESIVE MASTISOL STRL (MISCELLANEOUS) ×4 IMPLANT
BAG URINE DRAINAGE (UROLOGICAL SUPPLIES) ×4 IMPLANT
BLADE SURG SZ11 CARB STEEL (BLADE) ×4 IMPLANT
CATH FOLEY 2WAY  5CC 16FR (CATHETERS) ×2
CATH URTH 16FR FL 2W BLN LF (CATHETERS) ×2 IMPLANT
CHLORAPREP W/TINT 26ML (MISCELLANEOUS) ×4 IMPLANT
CLOSURE WOUND 1/2 X4 (GAUZE/BANDAGES/DRESSINGS) ×1
DEFOGGER SCOPE WARMER CLEARIFY (MISCELLANEOUS) ×4 IMPLANT
DERMABOND ADVANCED (GAUZE/BANDAGES/DRESSINGS) ×2
DERMABOND ADVANCED .7 DNX12 (GAUZE/BANDAGES/DRESSINGS) ×2 IMPLANT
ELECT REM PT RETURN 9FT ADLT (ELECTROSURGICAL) ×4
ELECTRODE REM PT RTRN 9FT ADLT (ELECTROSURGICAL) ×2 IMPLANT
GLOVE BIO SURGEON STRL SZ 6.5 (GLOVE) ×3 IMPLANT
GLOVE BIO SURGEONS STRL SZ 6.5 (GLOVE) ×1
GLOVE BIOGEL PI ORTHO PRO 7.5 (GLOVE) ×2
GLOVE INDICATOR 6.5 STRL GRN (GLOVE) ×4 IMPLANT
GLOVE INDICATOR 8.0 STRL GRN (GLOVE) ×4 IMPLANT
GLOVE PI ORTHO PRO STRL 7.5 (GLOVE) ×2 IMPLANT
GLOVE SURG SYN 8.0 (GLOVE) ×8 IMPLANT
GOWN STRL REUS W/ TWL LRG LVL3 (GOWN DISPOSABLE) ×2 IMPLANT
GOWN STRL REUS W/ TWL XL LVL3 (GOWN DISPOSABLE) ×4 IMPLANT
GOWN STRL REUS W/TWL LRG LVL3 (GOWN DISPOSABLE) ×2
GOWN STRL REUS W/TWL XL LVL3 (GOWN DISPOSABLE) ×4
HANDLE YANKAUER SUCT BULB TIP (MISCELLANEOUS) ×4 IMPLANT
IRRIGATION STRYKERFLOW (MISCELLANEOUS) IMPLANT
IRRIGATOR STRYKERFLOW (MISCELLANEOUS)
IV LACTATED RINGERS 1000ML (IV SOLUTION) IMPLANT
KIT PINK PAD W/HEAD ARE REST (MISCELLANEOUS) ×4
KIT PINK PAD W/HEAD ARM REST (MISCELLANEOUS) ×2 IMPLANT
KIT TURNOVER CYSTO (KITS) ×4 IMPLANT
LIGASURE LAP MARYLAND 5MM 37CM (ELECTROSURGICAL) ×4 IMPLANT
NEEDLE HYPO 22GX1.5 SAFETY (NEEDLE) ×4 IMPLANT
PACK BASIN MINOR ARMC (MISCELLANEOUS) ×4 IMPLANT
PACK GYN LAPAROSCOPIC (MISCELLANEOUS) ×4 IMPLANT
PAD OB MATERNITY 4.3X12.25 (PERSONAL CARE ITEMS) ×4 IMPLANT
PAD PREP 24X41 OB/GYN DISP (PERSONAL CARE ITEMS) ×4 IMPLANT
SLEEVE ENDOPATH XCEL 5M (ENDOMECHANICALS) ×8 IMPLANT
SOLUTION ELECTROLUBE (MISCELLANEOUS) ×4 IMPLANT
SPONGE LAP 18X18 RF (DISPOSABLE) ×4 IMPLANT
STRIP CLOSURE SKIN 1/2X4 (GAUZE/BANDAGES/DRESSINGS) ×3 IMPLANT
SUT VIC AB 0 CT1 27 (SUTURE) ×4
SUT VIC AB 0 CT1 27XCR 8 STRN (SUTURE) ×4 IMPLANT
SUT VIC AB 0 CT1 36 (SUTURE) ×4 IMPLANT
SUT VIC AB 4-0 FS2 27 (SUTURE) ×4 IMPLANT
SYR 10ML LL (SYRINGE) ×4 IMPLANT
TAPE TRANSPORE STRL 2 31045 (GAUZE/BANDAGES/DRESSINGS) IMPLANT
TROCAR 5M 150ML BLDLS (TROCAR) ×8 IMPLANT
TROCAR XCEL NON-BLD 5MMX100MML (ENDOMECHANICALS) ×4 IMPLANT
TUBING INSUF HEATED (TUBING) ×4 IMPLANT

## 2017-11-23 NOTE — Anesthesia Procedure Notes (Signed)
Procedure Name: Intubation Date/Time: 11/23/2017 9:27 AM Performed by: Jonna Clark, CRNA Pre-anesthesia Checklist: Patient identified, Patient being monitored, Timeout performed, Emergency Drugs available and Suction available Patient Re-evaluated:Patient Re-evaluated prior to induction Oxygen Delivery Method: Circle system utilized Preoxygenation: Pre-oxygenation with 100% oxygen Induction Type: IV induction Ventilation: Mask ventilation without difficulty Laryngoscope Size: Mac and 3 Grade View: Grade II Tube type: Oral Tube size: 7.0 mm Number of attempts: 1 Placement Confirmation: ETT inserted through vocal cords under direct vision,  positive ETCO2 and breath sounds checked- equal and bilateral Secured at: 21 cm Tube secured with: Tape Dental Injury: Teeth and Oropharynx as per pre-operative assessment

## 2017-11-23 NOTE — Transfer of Care (Signed)
Immediate Anesthesia Transfer of Care Note  Patient: Marie Palmer  Procedure(s) Performed: LAPAROSCOPIC ASSISTED VAGINAL HYSTERECTOMY (N/A Abdomen) BILATERAL SALPINGECTOMY (Bilateral )  Patient Location: PACU  Anesthesia Type:General  Level of Consciousness: drowsy and responds to stimulation  Airway & Oxygen Therapy: Patient Spontanous Breathing and Patient connected to face mask oxygen  Post-op Assessment: Report given to RN and Post -op Vital signs reviewed and stable  Post vital signs: Reviewed and stable  Last Vitals:  Vitals Value Taken Time  BP 128/80 11/23/2017 11:42 AM  Temp 36.4 C 11/23/2017 11:42 AM  Pulse 61 11/23/2017 11:43 AM  Resp 12 11/23/2017 11:43 AM  SpO2 97 % 11/23/2017 11:43 AM  Vitals shown include unvalidated device data.  Last Pain:  Vitals:   11/23/17 0759  PainSc: 0-No pain         Complications: No apparent anesthesia complications

## 2017-11-23 NOTE — Anesthesia Postprocedure Evaluation (Signed)
Anesthesia Post Note  Patient: Marie Palmer  Procedure(s) Performed: LAPAROSCOPIC ASSISTED VAGINAL HYSTERECTOMY (N/A Abdomen) BILATERAL SALPINGECTOMY (Bilateral )  Patient location during evaluation: PACU Anesthesia Type: General Level of consciousness: awake and alert Pain management: pain level controlled Vital Signs Assessment: post-procedure vital signs reviewed and stable Respiratory status: spontaneous breathing, nonlabored ventilation, respiratory function stable and patient connected to nasal cannula oxygen Cardiovascular status: blood pressure returned to baseline and stable Postop Assessment: no apparent nausea or vomiting Anesthetic complications: no     Last Vitals:  Vitals:   11/23/17 1342 11/23/17 1401  BP: (!) 145/81 137/82  Pulse: 60 60  Resp: 16 18  Temp: (!) 36.1 C 36.9 C  SpO2: 98% 97%    Last Pain:  Vitals:   11/23/17 1342  PainSc: Asleep                 Jovita Gamma

## 2017-11-23 NOTE — Anesthesia Post-op Follow-up Note (Signed)
Anesthesia QCDR form completed.        

## 2017-11-23 NOTE — H&P (Signed)
PRE-OPERATIVE HISTORY AND PHYSICAL EXAM  [id number], No Pcp Per Subjective:   [id number] M Lemireis a 63 y.N.[id number]. No LMP recorded.She presents today for a pre-op discussion and PE.  She has the following symptoms:She has had significant dysfunctional uterine bleeding with multiple periods per month. She also complains of significant cramping when she does have hermenses.She has tried multiple methods of hormonal control including OCPs and IUD and has failed them. She recently had an abortion because she was "unable to take birth control". She is specifically requesting definitive management/hysterectomy.  Review of Systems:   Constitutional: Denied constitutional symptoms, night sweats, recent illness, fatigue, fever, insomnia and weight loss.  Eyes: Denied eye symptoms, eye pain, photophobia, vision change and visual disturbance.  Ears/Nose/Throat/Neck: Denied ear, nose, throat or neck symptoms, hearing loss, nasal discharge, sinus congestion and sore throat.  Cardiovascular: Denied cardiovascular symptoms, arrhythmia, chest pain/pressure, edema, exercise intolerance, orthopnea and palpitations.  Respiratory: Denied pulmonary symptoms, asthma, pleuritic pain, productive sputum, cough, dyspnea and wheezing.  Gastrointestinal: Denied, gastro-esophageal reflux, melena, nausea and vomiting.  Genitourinary: See HPI for additional information.  Musculoskeletal: Denied musculoskeletal symptoms, stiffness, swelling, muscle weakness and myalgia.  Dermatologic: Denied dermatology symptoms, rash and scar.  Neurologic: Denied neurology symptoms, dizziness, headache, neck pain and syncope.  Psychiatric: Denied psychiatric symptoms, anxiety and depression.  Endocrine: Denied endocrine symptoms including hot flashes and night sweats.                   OB History  Gravida Para Term Preterm AB Living  3 3 2 1  3     SAB TAB Ectopic Multiple Live Births        0 3       # Outcome Date GA Lbr Len/2nd Weight Sex Delivery Anes PTL Lv  3 Term 06/28/16 [redacted]w[redacted]d / 00:18 8 lb 4.6 oz (3.76 kg) F Vag-Spont EPI  LIV  2 Term 2014 [redacted]w[redacted]d  6 lb 13 oz (3.09 kg) F Vag-Spont EPI  LIV  1 Preterm 2010 [redacted]w[redacted]d  6 lb 12 oz (3.062 kg) M Vag-Spont EPI  LIV  Complications: Gestational diabetes, Pre-eclampsia    Obstetric Comments  G1- Induction for pre-eclampsia. Also had GDM.         Past Medical History:  Diagnosis Date  . History of gestational diabetes   . History of pre-eclampsia   . Migraines          Past Surgical History:  Procedure Laterality Date  . CHOLECYSTECTOMY      SOCIAL HISTORY: Social History       Tobacco Use  Smoking Status Former Smoker  . Packs/day: 0.25  . Last attempt to quit: 10/23/2015  . Years since quitting: 1.9  Smokeless Tobacco Never Used  Tobacco Comment   cessation after discovery of pregnancy   Social History       Substance and Sexual Activity   Alcohol Use No    Social History      Substance and Sexual Activity  Drug Use No         Family History  Problem Relation Age of Onset  . Ovarian cancer Mother   . Endometriosis Mother   . Endometriosis Maternal Aunt     ALLERGIES:Patient has no known allergies.  MEDS:        Current Outpatient Medications on File Prior to Visit  Medication Sig Dispense Refill  . acetaminophen (TYLENOL) 500 MG tablet Take 500 mg by mouth every 6 (six) hours as needed.    Marland Kitchen  sertraline (ZOLOFT) 50 MG tablet Take 1 tablet (50 mg total) by mouth daily. 30 tablet 1   No current facility-administered medications on file prior to visit.    No orders of the defined types were placed in this encounter.   Physical examination BP 112/78  Pulse 76  Ht 5\' 8"  (1.727 m)  Wt 258 lb (117 kg)  BMI 39.23 kg/m   General NAD, Conversant  HEENT  Atraumatic; Op clear with mmm. Normo-cephalic. Pupils reactive. Anicteric sclerae  Thyroid/Neck Smooth without nodularity or enlargement. Normal ROM. Neck Supple.  Skin No rashes, lesions or ulceration. Normal palpated skin turgor. No nodularity.  Breasts: No masses or discharge. Symmetric. No axillary adenopathy.  Lungs: Clear to auscultation.No rales or wheezes. Normal Respiratory effort, no retractions.  Heart: NSR. No murmurs or rubs appreciated. No periferal edema  Abdomen: Soft. Non-tender. No masses. No HSM. No hernia  Extremities: Moves all appropriately. Normal ROM for age. No lymphadenopathy.  Neuro: Oriented to PPT. Normal mood. Normal affect.           Pelvic:   Vulva: Normal appearance. No lesions.   Vagina: No lesions or abnormalities noted.   Support: Normal pelvic support.   Urethra No masses tenderness or scarring.   Meatus Normal size without lesions or prolapse.   Cervix: Normal ectropion. No lesions.   Anus: Normal exam. No lesions.   Perineum: Normal exam. No lesions.   Bimanual   Uterus: Enlarged 10 wks non-tender. Mobile. AV.    Adnexae: No masses. Non-tender to palpation.    Cul-de-sac: Negative for abnormality.     Assessment:   [id number]     Patient Active Problem List   Diagnosis Date Noted  . Gestational diabetes mellitus (GDM) controlled on oral hypoglycemic drug 06/27/2016  . Excessive fetal growth affecting management of mother in third trimester, antepartum 06/25/2016  . Labor and delivery, indication for care 06/24/2016  . Indication for care in labor or delivery 06/17/2016  . Group B Streptococcus carrier, +RV culture, currently pregnant 06/17/2016  . Supervision of high risk elderly multigravida in third trimester 06/17/2016  . Gestational diabetes mellitus (GDM) in third trimester 04/24/2016  . Sleep disturbances 02/20/2016  . Chlamydia infection affecting pregnancy in first trimester  01/27/2016  . Obesity in pregnancy 12/28/2015  . H/O gestational diabetes in prior pregnancy, currently pregnant 12/28/2015  . H/O pre-eclampsia in prior pregnancy, currently pregnant 12/28/2015  . Tobacco use disorder, mild, in sustained remission 12/28/2015    1. Preop examination   2. History of dysfunctional uterine bleeding    Her exam and dysfunctional bleeding likely consistent with adenomyosis.  Plan:    1.LAVH

## 2017-11-23 NOTE — Interval H&P Note (Signed)
History and Physical Interval Note:  11/23/2017 8:52 AM  Marie Palmer  has presented today for surgery, with the diagnosis of HISTORY OF DYSFUNCTIONAL UTERINE BLEEDING  The various methods of treatment have been discussed with the patient and family. After consideration of risks, benefits and other options for treatment, the patient has consented to  Procedure(s): LAPAROSCOPIC ASSISTED VAGINAL HYSTERECTOMY (N/A) as a surgical intervention .  The patient's history has been reviewed, patient examined, no change in status, stable for surgery.  I have reviewed the patient's chart and labs.  Questions were answered to the patient's satisfaction.     Brennan Bailey

## 2017-11-23 NOTE — Op Note (Signed)
OPERATIVE NOTE 11/23/2017 11:55 AM  PRE-OPERATIVE DIAGNOSIS:  1) HISTORY OF DYSFUNCTIONAL UTERINE BLEEDING  POST-OPERATIVE DIAGNOSIS:  Same  OPERATION: Procedure(s) (LRB): LAPAROSCOPIC ASSISTED VAGINAL HYSTERECTOMY (N/A) BILATERAL SALPINGECTOMY (Bilateral)     SURGEON(S): Surgeon(s) and Role:    Linzie Collin, MD - Primary ASSISTANT: DeFrancesco.   No other capable assistant available for this surgery which requires an experienced, high level assistant.    ANESTHESIA: General  ESTIMATED BLOOD LOSS: 300 mL  OPERATIVE FINDINGS: Normal ovaries  SPECIMEN:  ID Type Source Tests Collected by Time Destination  1 : uterus, cervix, bilateral falliopian tubes Tissue [facility] Gyn benign resection SURGICAL PATHOLOGY Linzie Collin, MD 11/23/2017 1103     COMPLICATIONS: None  DRAINS: Foley to gravity  DISPOSITION: Stable to recovery room  DESCRIPTION OF PROCEDURE:      The patient was prepped and draped in the dorsolithotomy position and placed under general anesthesia. The bladder was emptied. The cervix was grasped with a multi-toothed tenaculum and a uterine manipulator was placed within the cervical os respecting the position and curvature of the uterus. After changing gloves we proceeded abdominally. Because of the patient's body habitus we were unable to pass even the longest trocar through th umbilical incision.  He stomach was emptied.  A left upper quadrant incision was made and a 5mm port was placed intra-peritoneal.  After obtaining pneumoperitoneum the umbilical port was placed.  Approximately 3 and 1/2 L of carbon dioxide gas was instilled within the abdominal pelvic cavity. The laparoscope was placed and the pelvis and abdomen were carefully inspected. In the usual manner, under direct visualization right and left lower quadrant ports of 5 mm size were placed. Both ureters were identified in the pelvis prior to dissection or clamping and cutting of pedicles. The  ovaries were inspected and found to be normal in appearance.  The fallopian tubes were elevated and the mesenteric side systematically coagulated and divided allowing the tube to be removed at the time of uterine removal. The round ligaments were coagulated and divided and a bladder flap was created. The upper aspect of the broad ligament was clamped coagulated and divided. The uterine arteries were skeletonized, triply coagulated and divided. Careful inspection of all pedicles and the remainder of the pelvis was performed. Hemostasis was noted. The lower quadrant ports and the left upper quadrant ports were removed, hemostasis of the port sites was noted, and the incisions were closed in subcuticular manner. The laparoscope and trocar sleeve were removed from the infraumbilical incision, hemostasis was noted, and the incision was closed in a subcuticular manner. A long-acting anesthetic was employed in the skin incisions. We then proceeded vaginally. A weighted speculum was placed posteriorly. A multi-toothed tenaculum was used to grasp the cervix and the cervix was injected in a circumferential manner with a dilute Pitressin solution. An incision was made around the cervix and the vaginal mucosa was dissected off of the cervix. The posterior cul-de-sac was identified and entered and the weighted speculum was placed within this. The anterior cul-de-sac was identified and entered and a retractor was placed and used to retract the bladder anteriorly keeping it out of the operative field. The uterosacral ligaments were clamped divided and suture ligated. The cardinal ligaments were clamped divided and suture ligated. The small remaining pedicle was clamped divided and suture ligated bilaterally allowing delivery of the specimen. Angle sutures were placed in the manner. A culdoplasty was performed. The peritoneum was identified anteriorly and then  incorporating the left upper pedicle left lower pedicle right lower  pedicle right upper pedicle and anterior peritoneum a pursestring suture was placed exteriorizing all pedicles. Hemostasis of all pedicles was noted at this time. The vaginal mucosa was then closed with a running suture of Vicryl.  The patient went to recovery room in stable condition. Clear urine was noted in the Foley at the conclusion of the procedure.  [name], M.D. 11/23/2017 11:55 AM

## 2017-11-23 NOTE — Anesthesia Preprocedure Evaluation (Addendum)
Anesthesia Evaluation  Patient identified by MRN, date of birth, ID band Patient awake    Reviewed: Allergy & Precautions, H&P , NPO status , Patient's Chart, lab work & pertinent test results  Airway Mallampati: III  TM Distance: >3 FB Neck ROM: full    Dental  (+) Teeth Intact   Pulmonary neg pulmonary ROS, Current Smoker, former smoker,     + decreased breath sounds      Cardiovascular hypertension, negative cardio ROS   Rhythm:regular Rate:Normal  H/o pre-ecclampsia   Neuro/Psych  Headaches, PSYCHIATRIC DISORDERS Anxiety negative neurological ROS  negative psych ROS   GI/Hepatic negative GI ROS, Neg liver ROS,   Endo/Other  negative endocrine ROSdiabetesMorbid obesityH/o gestational DM  Renal/GU      Musculoskeletal   Abdominal   Peds  Hematology negative hematology ROS (+)   Anesthesia Other Findings Past Medical History: No date: Anxiety No date: History of gestational diabetes No date: History of pre-eclampsia No date: Migraines     Comment:  migraines  Past Surgical History: No date: CHOLECYSTECTOMY 09/11/2017: INDUCED ABORTION No date: TONSILLECTOMY  BMI    Body Mass Index:  39.56 kg/m      Reproductive/Obstetrics negative OB ROS                            Anesthesia Physical Anesthesia Plan  ASA: III  Anesthesia Plan: General ETT   Post-op Pain Management:    Induction:   PONV Risk Score and Plan: 3 and Ondansetron, Dexamethasone and Midazolam  Airway Management Planned:   Additional Equipment:   Intra-op Plan:   Post-operative Plan:   Informed Consent: I have reviewed the patients History and Physical, chart, labs and discussed the procedure including the risks, benefits and alternatives for the proposed anesthesia with the patient or authorized representative who has indicated his/her understanding and acceptance.   Dental Advisory Given  Plan  Discussed with: Anesthesiologist, CRNA and Surgeon  Anesthesia Plan Comments:        Anesthesia Quick Evaluation

## 2017-11-24 ENCOUNTER — Encounter: Payer: Self-pay | Admitting: Obstetrics and Gynecology

## 2017-11-24 DIAGNOSIS — N938 Other specified abnormal uterine and vaginal bleeding: Secondary | ICD-10-CM | POA: Diagnosis not present

## 2017-11-24 MED ORDER — OXYCODONE-ACETAMINOPHEN 5-325 MG PO TABS: 1.0000 | ORAL_TABLET | ORAL | 0 refills | Status: DC | PRN

## 2017-11-24 MED ORDER — HYDROCODONE-ACETAMINOPHEN 5-325 MG PO TABS: 1.0000 | ORAL_TABLET | Freq: Four times a day (QID) | ORAL | 0 refills | Status: DC | PRN

## 2017-11-24 NOTE — Progress Notes (Signed)
Patient discharged home. Discharge instructions, prescriptions and follow up appointment given to and reviewed with patient. Patient verbalized understanding. Patient wheeled out by auxiliary.  

## 2017-11-24 NOTE — Discharge Summary (Signed)
    Discharge Summary  Admit date: 11/23/2017  Discharge Date and Time:11/24/2017  1:35 PM  Discharge to:  Home  Admission Diagnosis: Present on Admission: . DUB (dysfunctional uterine bleeding)                     Discharge  Diagnoses: Active Problems:   DUB (dysfunctional uterine bleeding)   OR Procedures:   Procedure(s): LAPAROSCOPIC ASSISTED VAGINAL HYSTERECTOMY BILATERAL SALPINGECTOMY Date -------------------                              Discharge Day Progress Note:   Subjective:   The patient does not have complaints.  She is ambulating well. She is taking PO well. Her pain is well controlled with her current medications. She is urinating without difficulty and is passing flatus.   Objective:  BP 131/74 (BP Location: Right Arm)   Pulse 61   Temp 98.5 F (36.9 C) (Oral)   Resp 18   Ht 5\' 8"  (1.727 m)   Wt 118 kg   LMP 10/25/2017 (Exact Date)   SpO2 97%   BMI 39.56 kg/m     Abdomen:                         clean, dry, no drainage    Assessment:   Doing well.  Normal progress as expected.     Plan:        Discharge home.                       Medications as directed.  Hospital Course:  No notes on file   Condition at Discharge:  good Discharge Medications:  Allergies as of 11/24/2017   No Known Allergies     Medication List    STOP taking these medications   acetaminophen 500 MG tablet Commonly known as:  TYLENOL   aspirin-acetaminophen-caffeine 250-250-65 MG tablet Commonly known as:  EXCEDRIN MIGRAINE   metroNIDAZOLE 500 MG tablet Commonly known as:  FLAGYL     TAKE these medications   HYDROcodone-acetaminophen 5-325 MG tablet Commonly known as:  NORCO/VICODIN Take 1-2 tablets by mouth every 6 (six) hours as needed for moderate pain.   OMEGA-3 GUMMIES PO Take 2 tablets by mouth daily.   oxyCODONE-acetaminophen 5-325 MG tablet Commonly known as:  PERCOCET/ROXICET Take 1-2 tablets by mouth every 4 (four) hours as needed for moderate  pain or severe pain.   sertraline 50 MG tablet Commonly known as:  ZOLOFT Take 1 tablet (50 mg total) by mouth daily. What changed:  when to take this        Follow Up:   Follow-up Information    Linzie Collin, MD. Schedule an appointment as soon as possible for a visit in 1 week(s).   Specialty:  Obstetrics and Gynecology Why:  please call and make a follow up appointment for 1 week with dr. Ralph Dowdy information: [address] [place] [address] [phone]           [name], M.D. 11/24/2017 1:35 PM

## 2017-11-25 LAB — SURGICAL PATHOLOGY

## 2017-12-07 NOTE — Progress Notes (Signed)
Pt presents today for Post Op after [facility]. Pt is doing well and needs a work release note.

## 2017-12-08 ENCOUNTER — Telehealth: Payer: Self-pay | Admitting: Obstetrics and Gynecology

## 2017-12-08 ENCOUNTER — Ambulatory Visit (INDEPENDENT_AMBULATORY_CARE_PROVIDER_SITE_OTHER): Payer: Medicaid Other | Admitting: Obstetrics and Gynecology

## 2017-12-08 ENCOUNTER — Encounter: Payer: Self-pay | Admitting: Obstetrics and Gynecology

## 2017-12-08 VITALS — BP 118/80 | HR 80 | Ht 68.0 in | Wt 255.0 lb

## 2017-12-08 DIAGNOSIS — Z9889 Other specified postprocedural states: Secondary | ICD-10-CM

## 2017-12-08 NOTE — Telephone Encounter (Signed)
Patient was rude while checking in. I tried to ask the patient registration questions and she spoke over me and rolled her eyes. I informed the patient that Dr. Logan BoresEvans may be a few minutes behind and she said "Oh God Great" and walked away rolling her eyes at me. I attempted to help the patient the best way that I could but she was extremely moody.

## 2017-12-08 NOTE — Progress Notes (Signed)
HPI:      Marie Palmer is a 27 y.o. [phone] who LMP was Patient's last menstrual period was 10/25/2017 (exact date).  Subjective:   She presents today a little bit over 1 week postop.  She is doing well.  She has no complaints.  She is ambulating voiding having bowel movements and eating without difficulty.  She describes no pain.  She reports no vaginal bleeding.  She is specifically requesting a back to work note.    Hx: The following portions of the patient's history were reviewed and updated as appropriate:             She  has a past medical history of Anxiety, History of gestational diabetes, History of pre-eclampsia, and Migraines. She does not have any pertinent problems on file. She  has a past surgical history that includes Cholecystectomy; Induced abortion (09/11/2017); Tonsillectomy; Laparoscopic assisted vaginal hysterectomy (N/A, 11/23/2017); and Bilateral salpingectomy (Bilateral, 11/23/2017). Her family history includes Endometriosis in her maternal aunt and mother; Ovarian cancer in her mother. She  reports that she quit smoking about 2 years ago. Her smoking use included cigarettes. She has a 1.75 pack-year smoking history. She has never used smokeless tobacco. She reports that she does not drink alcohol or use drugs. She has a current medication list which includes the following prescription(s): fish oil-cholecalciferol, hydrocodone-acetaminophen, oxycodone-acetaminophen, and sertraline. She has No Known Allergies.       Review of Systems:  Review of Systems  Constitutional: Denied constitutional symptoms, night sweats, recent illness, fatigue, fever, insomnia and weight loss.  Eyes: Denied eye symptoms, eye pain, photophobia, vision change and visual disturbance.  Ears/Nose/Throat/Neck: Denied ear, nose, throat or neck symptoms, hearing loss, nasal discharge, sinus congestion and sore throat.  Cardiovascular: Denied cardiovascular symptoms, arrhythmia, chest  pain/pressure, edema, exercise intolerance, orthopnea and palpitations.  Respiratory: Denied pulmonary symptoms, asthma, pleuritic pain, productive sputum, cough, dyspnea and wheezing.  Gastrointestinal: Denied, gastro-esophageal reflux, melena, nausea and vomiting.  Genitourinary: Denied genitourinary symptoms including symptomatic vaginal discharge, pelvic relaxation issues, and urinary complaints.  Musculoskeletal: Denied musculoskeletal symptoms, stiffness, swelling, muscle weakness and myalgia.  Dermatologic: Denied dermatology symptoms, rash and scar.  Neurologic: Denied neurology symptoms, dizziness, headache, neck pain and syncope.  Psychiatric: Denied psychiatric symptoms, anxiety and depression.  Endocrine: Denied endocrine symptoms including hot flashes and night sweats.   Meds:   Current Outpatient Medications on File Prior to Visit  Medication Sig Dispense Refill  . Fish Oil-Cholecalciferol (OMEGA-3 GUMMIES PO) Take 2 tablets by mouth daily.    Marland Kitchen. HYDROcodone-acetaminophen (NORCO/VICODIN) 5-325 MG tablet Take 1-2 tablets by mouth every 6 (six) hours as needed for moderate pain. (Patient not taking: Reported on 12/08/2017) 30 tablet 0  . oxyCODONE-acetaminophen (PERCOCET/ROXICET) 5-325 MG tablet Take 1-2 tablets by mouth every 4 (four) hours as needed for moderate pain or severe pain. (Patient not taking: Reported on 12/08/2017) 20 tablet 0  . sertraline (ZOLOFT) 50 MG tablet Take 1 tablet (50 mg total) by mouth daily. (Patient taking differently: Take 50 mg by mouth every evening. ) 30 tablet 1   No current facility-administered medications on file prior to visit.     Objective:     Vitals:   12/08/17 0851  BP: 118/80  Pulse: 80               Abdomen: Soft.  Non-tender.  No masses.  No HSM.  Incision/s: Intact.  Healing well.  No erythema.  No drainage.  Assessment:    [id number] Patient Active Problem List   Diagnosis Date Noted  . DUB (dysfunctional uterine  bleeding) 11/23/2017  . Gestational diabetes mellitus (GDM) controlled on oral hypoglycemic drug 06/27/2016  . Excessive fetal growth affecting management of mother in third trimester, antepartum 06/25/2016  . Labor and delivery, indication for care 06/24/2016  . Indication for care in labor or delivery 06/17/2016  . Group B Streptococcus carrier, +RV culture, currently pregnant 06/17/2016  . Supervision of high risk elderly multigravida in third trimester 06/17/2016  . Gestational diabetes mellitus (GDM) in third trimester 04/24/2016  . Sleep disturbances 02/20/2016  . Chlamydia infection affecting pregnancy in first trimester 01/27/2016  . Obesity in pregnancy 12/28/2015  . H/O gestational diabetes in prior pregnancy, currently pregnant 12/28/2015  . H/O pre-eclampsia in prior pregnancy, currently pregnant 12/28/2015  . Tobacco use disorder, mild, in sustained remission 12/28/2015     1. Post-operative state     Patient with excellent rapid recovery status post LAVH   Plan:            1.  Patient may resume normal activities with exception of heavy lifting and nothing in the vagina.  We discussed limitations in detail. Orders No orders of the defined types were placed in this encounter.   No orders of the defined types were placed in this encounter.     F/U  Return in about 4 weeks (around 01/05/2018).  [name], M.D. 12/08/2017 9:31 AM

## 2017-12-31 NOTE — Progress Notes (Deleted)
Pt presents today for incision check

## 2018-01-05 ENCOUNTER — Encounter: Payer: Medicaid Other | Admitting: Obstetrics and Gynecology

## 2018-01-06 ENCOUNTER — Encounter: Payer: Medicaid Other | Admitting: Obstetrics and Gynecology

## 2018-01-07 ENCOUNTER — Encounter: Payer: Self-pay | Admitting: Obstetrics and Gynecology

## 2018-01-07 ENCOUNTER — Ambulatory Visit (INDEPENDENT_AMBULATORY_CARE_PROVIDER_SITE_OTHER): Payer: Medicaid Other | Admitting: Obstetrics and Gynecology

## 2018-01-07 VITALS — BP 111/74 | HR 84 | Ht 68.0 in | Wt 261.0 lb

## 2018-01-07 DIAGNOSIS — Z9889 Other specified postprocedural states: Secondary | ICD-10-CM

## 2018-01-07 NOTE — Progress Notes (Signed)
HPI:      [name] is a 27 y.o. [phone] who LMP was Patient's last menstrual period was 10/25/2017 (exact date).  Subjective:   She presents today for 6-week follow-up from LAVH.  She reports no problems.  She has no pain.  She is ambulating voiding having bowel movements and eating without difficulty.  She has not resumed intercourse.  She reports no vaginal bleeding.    Hx: The following portions of the patient's history were reviewed and updated as appropriate:             She  has a past medical history of Anxiety, History of gestational diabetes, History of pre-eclampsia, and Migraines. She does not have any pertinent problems on file. She  has a past surgical history that includes Cholecystectomy; Induced abortion (09/11/2017); Tonsillectomy; Laparoscopic assisted vaginal hysterectomy (N/A, 11/23/2017); and Bilateral salpingectomy (Bilateral, 11/23/2017). Her family history includes Endometriosis in her maternal aunt and mother; Ovarian cancer in her mother. She  reports that she quit smoking about 2 years ago. Her smoking use included cigarettes. She has a 1.75 pack-year smoking history. She has never used smokeless tobacco. She reports that she does not drink alcohol or use drugs. She has a current medication list which includes the following prescription(s): fish oil-cholecalciferol, hydrocodone-acetaminophen, oxycodone-acetaminophen, and sertraline. She has No Known Allergies.       Review of Systems:  Review of Systems  Constitutional: Denied constitutional symptoms, night sweats, recent illness, fatigue, fever, insomnia and weight loss.  Eyes: Denied eye symptoms, eye pain, photophobia, vision change and visual disturbance.  Ears/Nose/Throat/Neck: Denied ear, nose, throat or neck symptoms, hearing loss, nasal discharge, sinus congestion and sore throat.  Cardiovascular: Denied cardiovascular symptoms, arrhythmia, chest pain/pressure, edema, exercise intolerance, orthopnea and  palpitations.  Respiratory: Denied pulmonary symptoms, asthma, pleuritic pain, productive sputum, cough, dyspnea and wheezing.  Gastrointestinal: Denied, gastro-esophageal reflux, melena, nausea and vomiting.  Genitourinary: Denied genitourinary symptoms including symptomatic vaginal discharge, pelvic relaxation issues, and urinary complaints.  Musculoskeletal: Denied musculoskeletal symptoms, stiffness, swelling, muscle weakness and myalgia.  Dermatologic: Denied dermatology symptoms, rash and scar.  Neurologic: Denied neurology symptoms, dizziness, headache, neck pain and syncope.  Psychiatric: Denied psychiatric symptoms, anxiety and depression.  Endocrine: Denied endocrine symptoms including hot flashes and night sweats.   Meds:   Current Outpatient Medications on File Prior to Visit  Medication Sig Dispense Refill  . Fish Oil-Cholecalciferol (OMEGA-3 GUMMIES PO) Take 2 tablets by mouth daily.    Marland Kitchen HYDROcodone-acetaminophen (NORCO/VICODIN) 5-325 MG tablet Take 1-2 tablets by mouth every 6 (six) hours as needed for moderate pain. (Patient not taking: Reported on 12/08/2017) 30 tablet 0  . oxyCODONE-acetaminophen (PERCOCET/ROXICET) 5-325 MG tablet Take 1-2 tablets by mouth every 4 (four) hours as needed for moderate pain or severe pain. (Patient not taking: Reported on 12/08/2017) 20 tablet 0  . sertraline (ZOLOFT) 50 MG tablet Take 1 tablet (50 mg total) by mouth daily. (Patient taking differently: Take 50 mg by mouth every evening. ) 30 tablet 1   No current facility-administered medications on file prior to visit.     Objective:     Vitals:   01/07/18 0944  BP: 111/74  Pulse: 84     Abdomen: Soft.  Non-tender.  No masses.  No HSM.  Incision/s: Intact.  Healing well.  No erythema.  No drainage.    Pelvic:   Vulva: Normal appearance.  No lesions.  Vagina: No lesions or abnormalities noted.  Support: Normal pelvic support.  Urethra No masses tenderness or scarring.  Meatus  Normal size without lesions or prolapse  Vag Cuff: Intact.  No lesions.  Anus: Normal exam.  No lesions.  Perineum: Normal exam.  No lesions.        Bimanual   Adnexae: No masses.  Non-tender to palpation.  Cuff: Negative for abnormality.       Assessment:    [id number] Patient Active Problem List   Diagnosis Date Noted  . DUB (dysfunctional uterine bleeding) 11/23/2017  . Gestational diabetes mellitus (GDM) controlled on oral hypoglycemic drug 06/27/2016  . Excessive fetal growth affecting management of mother in third trimester, antepartum 06/25/2016  . Labor and delivery, indication for care 06/24/2016  . Indication for care in labor or delivery 06/17/2016  . Group B Streptococcus carrier, +RV culture, currently pregnant 06/17/2016  . Supervision of high risk elderly multigravida in third trimester 06/17/2016  . Gestational diabetes mellitus (GDM) in third trimester 04/24/2016  . Sleep disturbances 02/20/2016  . Chlamydia infection affecting pregnancy in first trimester 01/27/2016  . Obesity in pregnancy 12/28/2015  . H/O gestational diabetes in prior pregnancy, currently pregnant 12/28/2015  . H/O pre-eclampsia in prior pregnancy, currently pregnant 12/28/2015  . Tobacco use disorder, mild, in sustained remission 12/28/2015     1. Post-operative state     Patient with excellent recovery.  No problems.   Plan:            1.  Patient may resume normal activities with exception of heavy lifting.  Refrain from intercourse for 2 more weeks. Orders No orders of the defined types were placed in this encounter.   No orders of the defined types were placed in this encounter.     F/U  Return in about 3 months (around 04/09/2018).  [name], M.D. 01/07/2018 10:05 AM

## 2018-01-07 NOTE — Progress Notes (Signed)
Pt presents today for PPV. Pt is doing well and has no concerns.

## 2018-04-09 ENCOUNTER — Encounter: Payer: Medicaid Other | Admitting: Obstetrics and Gynecology

## 2018-07-17 IMAGING — US US TRANSVAGINAL NON-OB
1 series · 14 of 25 positions shown · non-contrast
Comparison: 11/21/2015

CLINICAL DATA: Diffuse pelvic pain for 12 weeks. IUD placement in
July 2016. Patient delivered a baby in June 2016.

EXAM:
TRANSABDOMINAL AND TRANSVAGINAL ULTRASOUND OF PELVIS
TECHNIQUE: Both transabdominal and transvaginal ultrasound examinations of the
pelvis were performed. Transabdominal technique was performed for
global imaging of the pelvis including uterus, ovaries, adnexal
regions, and pelvic cul-de-sac. It was necessary to proceed with
endovaginal exam following the transabdominal exam to visualize the
endometrium and adnexa.

[Series 1: us transvaginal non-ob · 0.25mm/px · 122 acquisitions, 14 frames shown]
[im 1/122]
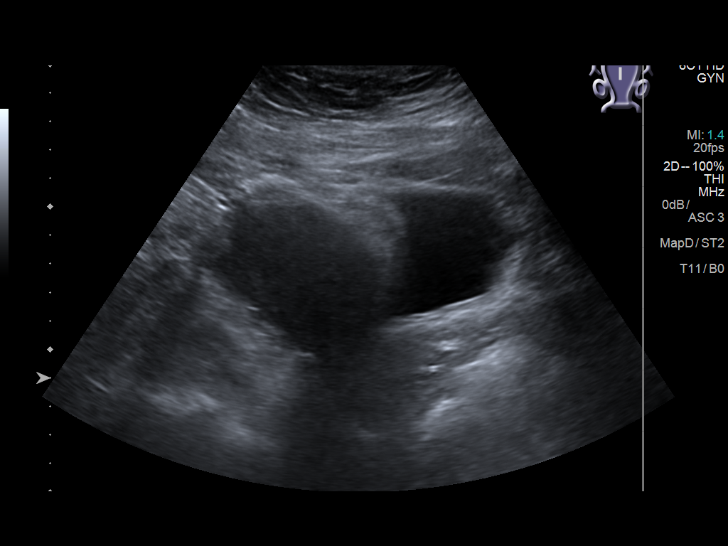
[im 11/122]
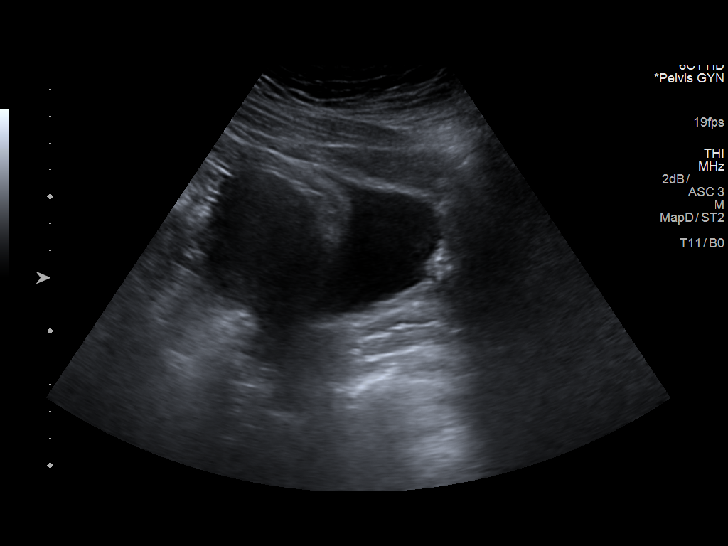
[im 21/122]
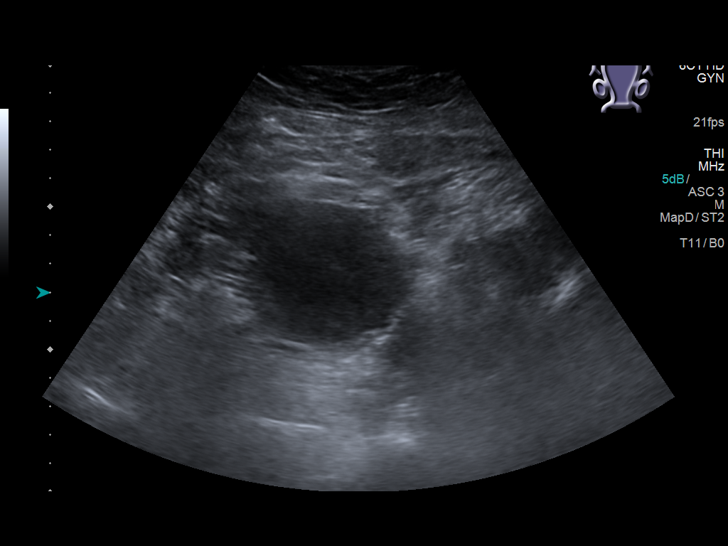
[im 31/122]
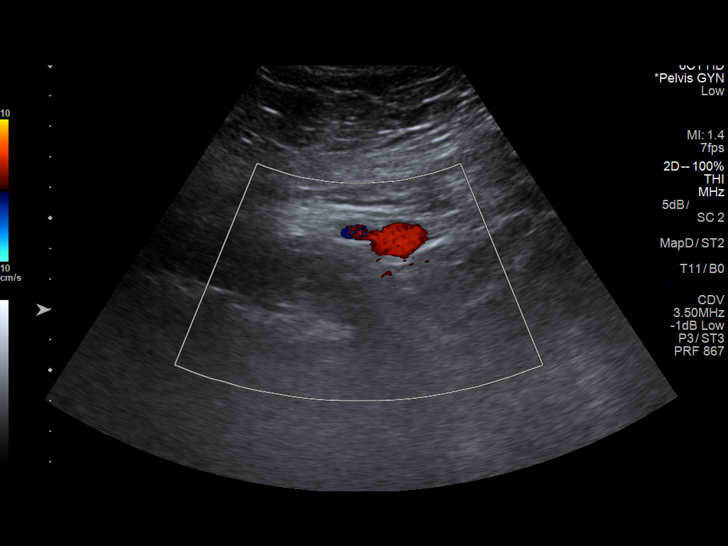
[im 41/122]
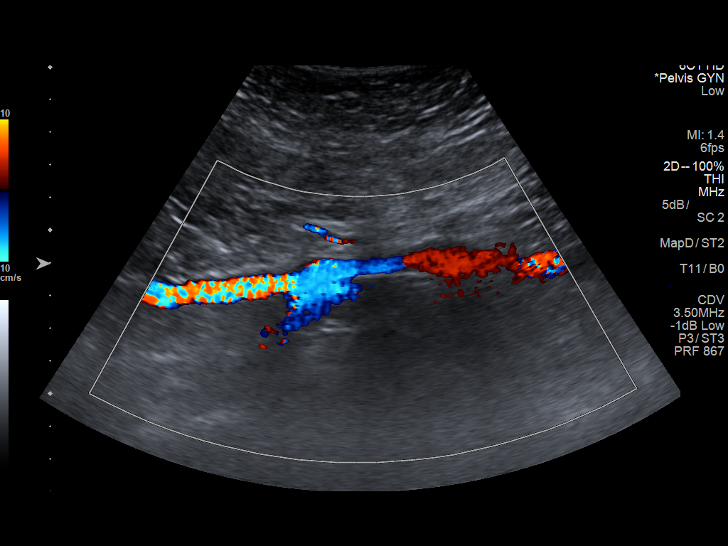
[im 46/122]
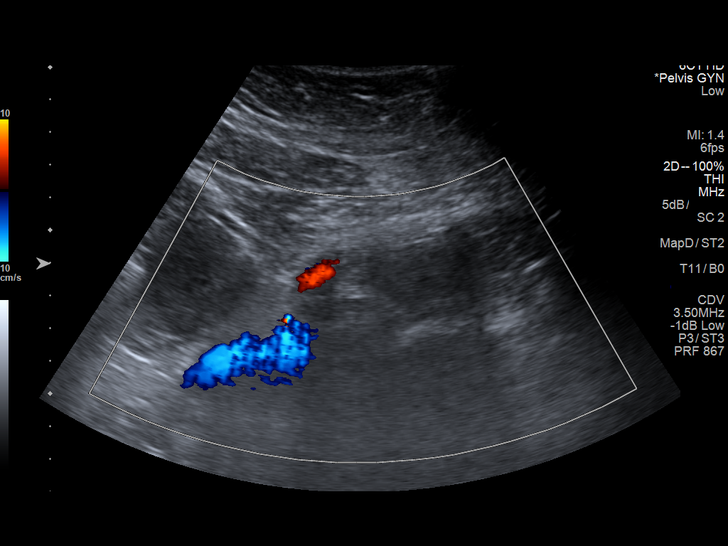
[im 56/122]
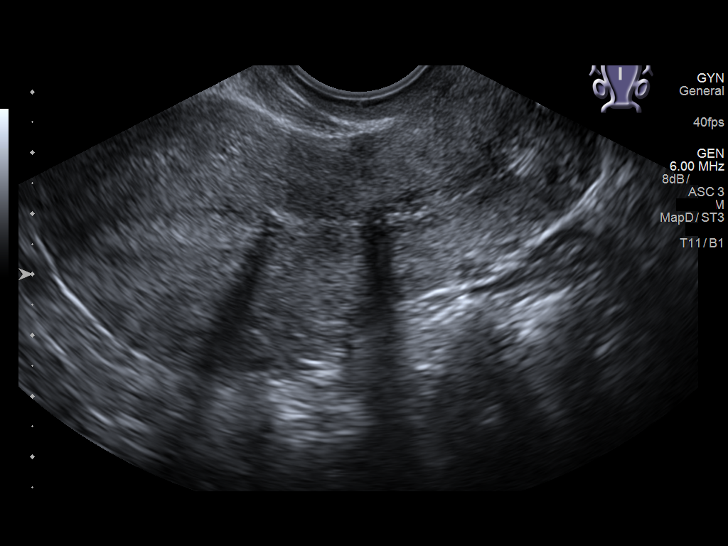
[im 66/122]
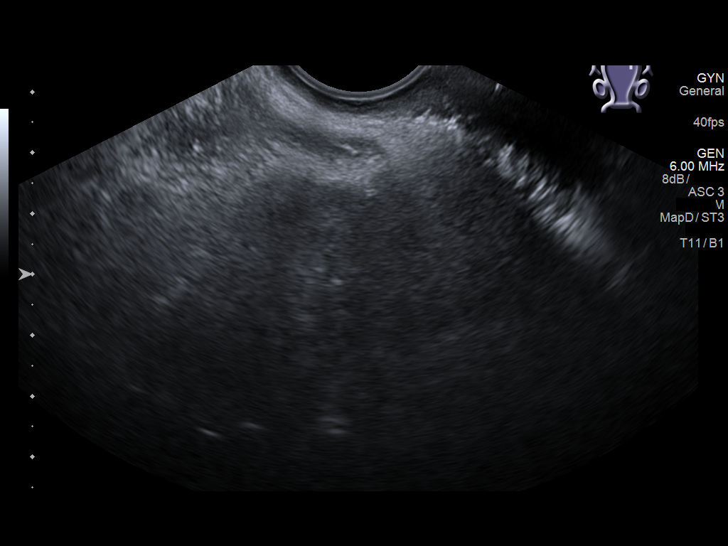
[im 76/122]
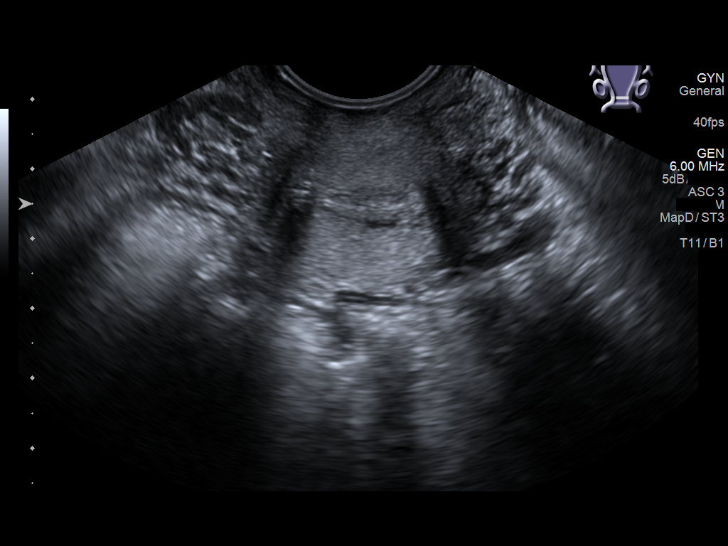
[im 81/122]
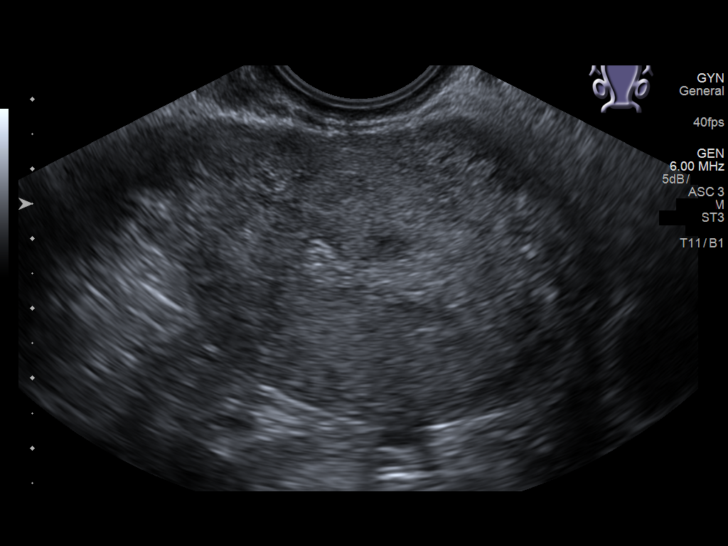
[im 91/122]
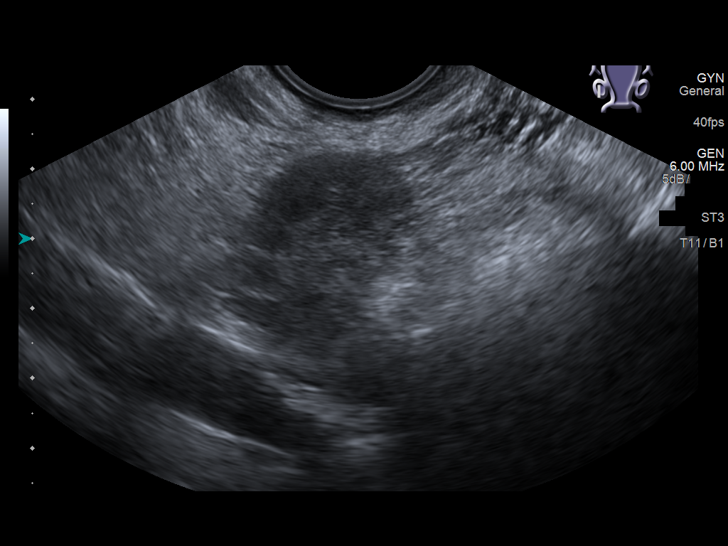
[im 101/122]
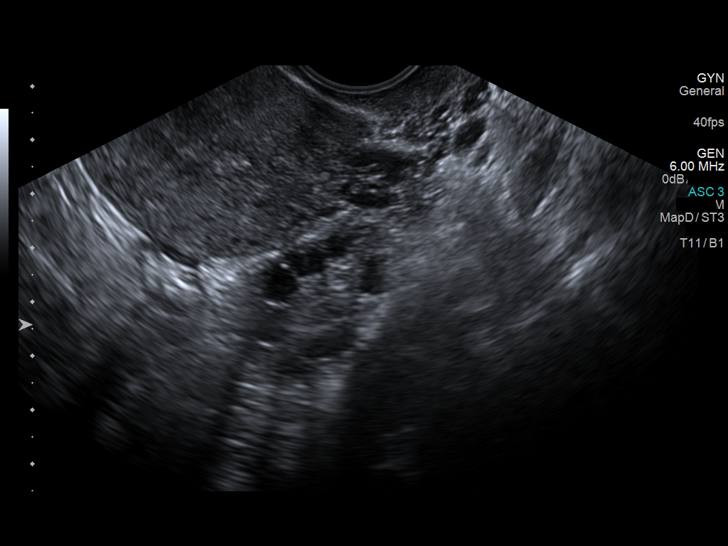
[im 111/122]
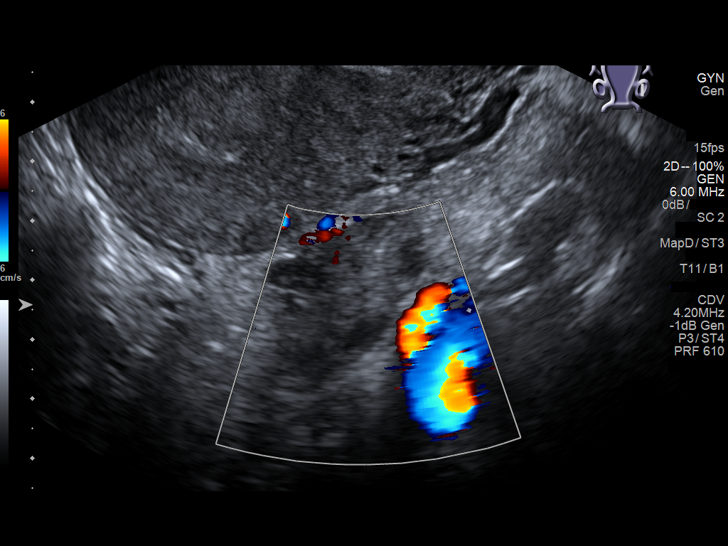
[im 122/122]
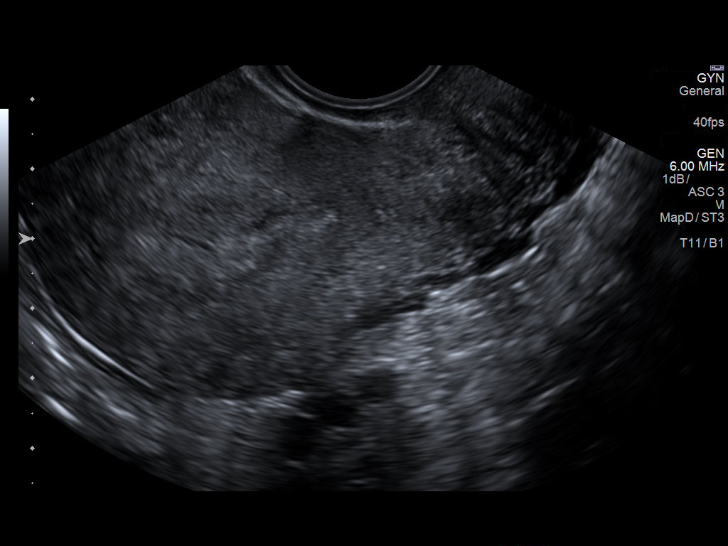

[14 of 25 positions shown; findings below may reference images not displayed]

FINDINGS: Uterus

Measurements: 9.6 x 4.5 x 4.7 cm. No fibroids or other mass
visualized. There is an IUD, which appears to be positioned in the
lower uterine segment, and is potentially malrotated.

Endometrium

Thickness: 7 mm.  No focal abnormality visualized.

Right ovary

Measurements: 3.5 x 2.4 x 2.1 cm. Normal appearance/no adnexal mass.

Left ovary

Measurements: 3.3 x 1.9 x 2.0 cm. Normal appearance/no adnexal mass.

Other findings

No abnormal free fluid.
IMPRESSION: Potential malpositioning of IUD in the lower uterine segment.

Normal appearance of the ovaries.

## 2020-01-02 ENCOUNTER — Encounter: Payer: Self-pay | Admitting: Obstetrics and Gynecology

## 2020-01-02 ENCOUNTER — Ambulatory Visit (INDEPENDENT_AMBULATORY_CARE_PROVIDER_SITE_OTHER): Payer: Medicaid Other | Admitting: Obstetrics and Gynecology

## 2020-01-02 ENCOUNTER — Telehealth: Payer: Self-pay

## 2020-01-02 ENCOUNTER — Other Ambulatory Visit: Payer: Self-pay

## 2020-01-02 VITALS — BP 129/84 | HR 85 | Ht 68.0 in | Wt 244.5 lb

## 2020-01-02 DIAGNOSIS — Z01419 Encounter for gynecological examination (general) (routine) without abnormal findings: Secondary | ICD-10-CM

## 2020-01-02 DIAGNOSIS — N898 Other specified noninflammatory disorders of vagina: Secondary | ICD-10-CM | POA: Diagnosis not present

## 2020-01-02 DIAGNOSIS — F32A Depression, unspecified: Secondary | ICD-10-CM | POA: Diagnosis not present

## 2020-01-02 DIAGNOSIS — R6882 Decreased libido: Secondary | ICD-10-CM

## 2020-01-02 MED ORDER — FLUCONAZOLE 150 MG PO TABS: 150.0000 mg | ORAL_TABLET | ORAL | 0 refills | Status: AC

## 2020-01-02 MED ORDER — PAROXETINE HCL 10 MG PO TABS: 10.0000 mg | ORAL_TABLET | Freq: Every day | ORAL | 1 refills | Status: DC

## 2020-01-02 MED ORDER — FLUCONAZOLE 150 MG PO TABS: 150.0000 mg | ORAL_TABLET | ORAL | 3 refills | Status: DC

## 2020-01-02 NOTE — Telephone Encounter (Addendum)
pharmacy called in they are unclear on the instructions for the paroxetine. They are requesting a call back.

## 2020-01-02 NOTE — Progress Notes (Signed)
Pt present for annual exam. Pt c/o of yeast infections sxs x 2 weeks due to taking antibiotics for tooth surgery. Pt is currently still taking clindamycin 300 mg QID. Pt also stated that her Zoloft is not helping with her depression and reported decreased sex drive since hysterectomy on 10/2017.  Completed depression screening today score PHQ-9=21.

## 2020-01-02 NOTE — Patient Instructions (Signed)
Preventive Care 20-31 Years Old, Female Preventive care refers to visits with your health care provider and lifestyle choices that can promote health and wellness. This includes:  A yearly physical exam. This may also be called an annual well check.  Regular dental visits and eye exams.  Immunizations.  Screening for certain conditions.  Healthy lifestyle choices, such as eating a healthy diet, getting regular exercise, not using drugs or products that contain nicotine and tobacco, and limiting alcohol use. What can I expect for my preventive care visit? Physical exam Your health care provider will check your:  Height and weight. This may be used to calculate body mass index (BMI), which tells if you are at a healthy weight.  Heart rate and blood pressure.  Skin for abnormal spots. Counseling Your health care provider may ask you questions about your:  Alcohol, tobacco, and drug use.  Emotional well-being.  Home and relationship well-being.  Sexual activity.  Eating habits.  Work and work Statistician.  Method of birth control.  Menstrual cycle.  Pregnancy history. What immunizations do I need?  Influenza (flu) vaccine  This is recommended every year. Tetanus, diphtheria, and pertussis (Tdap) vaccine  You may need a Td booster every 10 years. Varicella (chickenpox) vaccine  You may need this if you have not been vaccinated. Human papillomavirus (HPV) vaccine  If recommended by your health care provider, you may need three doses over 6 months. Measles, mumps, and rubella (MMR) vaccine  You may need at least one dose of MMR. You may also need a second dose. Meningococcal conjugate (MenACWY) vaccine  One dose is recommended if you are age 75-21 years and a first-year college student living in a residence hall, or if you have one of several medical conditions. You may also need additional booster doses. Pneumococcal conjugate (PCV13) vaccine  You may need  this if you have certain conditions and were not previously vaccinated. Pneumococcal polysaccharide (PPSV23) vaccine  You may need one or two doses if you smoke cigarettes or if you have certain conditions. Hepatitis A vaccine  You may need this if you have certain conditions or if you travel or work in places where you may be exposed to hepatitis A. Hepatitis B vaccine  You may need this if you have certain conditions or if you travel or work in places where you may be exposed to hepatitis B. Haemophilus influenzae type b (Hib) vaccine  You may need this if you have certain conditions. You may receive vaccines as individual doses or as more than one vaccine together in one shot (combination vaccines). Talk with your health care provider about the risks and benefits of combination vaccines. What tests do I need?  Blood tests  Lipid and cholesterol levels. These may be checked every 5 years starting at age 33.  Hepatitis C test.  Hepatitis B test. Screening  Diabetes screening. This is done by checking your blood sugar (glucose) after you have not eaten for a while (fasting).  Sexually transmitted disease (STD) testing.  BRCA-related cancer screening. This may be done if you have a family history of breast, ovarian, tubal, or peritoneal cancers.  Pelvic exam and Pap test. This may be done every 3 years starting at age 76. Starting at age 102, this may be done every 5 years if you have a Pap test in combination with an HPV test. Talk with your health care provider about your test results, treatment options, and if necessary, the need for more tests.  Follow these instructions at home: Eating and drinking   Eat a diet that includes fresh fruits and vegetables, whole grains, lean protein, and low-fat dairy.  Take vitamin and mineral supplements as recommended by your health care provider.  Do not drink alcohol if: ? Your health care provider tells you not to drink. ? You are  pregnant, may be pregnant, or are planning to become pregnant.  If you drink alcohol: ? Limit how much you have to 0-1 drink a day. ? Be aware of how much alcohol is in your drink. In the [place] one drink equals one 12 oz bottle of beer (355 mL), one 5 oz glass of wine (148 mL), or one 1 oz glass of hard liquor (44 mL). Lifestyle  Take daily care of your teeth and gums.  Stay active. Exercise for at least 30 minutes on 5 or more days each week.  Do not use any products that contain nicotine or tobacco, such as cigarettes, e-cigarettes, and chewing tobacco. If you need help quitting, ask your health care provider.  If you are sexually active, practice safe sex. Use a condom or other form of birth control (contraception) in order to prevent pregnancy and STIs (sexually transmitted infections). If you plan to become pregnant, see your health care provider for a preconception visit. What's next?  Visit your health care provider once a year for a well check visit.  Ask your health care provider how often you should have your eyes and teeth checked.  Stay up to date on all vaccines. This information is not intended to replace advice given to you by your health care provider. Make sure you discuss any questions you have with your health care provider. Document Revised: 11/12/2017 Document Reviewed: 11/12/2017 Elsevier Patient Education  2020 [employer] Breast Self-Awareness Breast self-awareness is knowing how your breasts look and feel. Doing breast self-awareness is important. It allows you to catch a breast problem early while it is still small and can be treated. All women should do breast self-awareness, including women who have had breast implants. Tell your doctor if you notice a change in your breasts. What you need:  A mirror.  A well-lit room. How to do a breast self-exam A breast self-exam is one way to learn what is normal for your breasts and to check for changes. To do a  breast self-exam: Look for changes  1. Take off all the clothes above your waist. 2. Stand in front of a mirror in a room with good lighting. 3. Put your hands on your hips. 4. Push your hands down. 5. Look at your breasts and nipples in the mirror to see if one breast or nipple looks different from the other. Check to see if: ? The shape of one breast is different. ? The size of one breast is different. ? There are wrinkles, dips, and bumps in one breast and not the other. 6. Look at each breast for changes in the skin, such as: ? Redness. ? Scaly areas. 7. Look for changes in your nipples, such as: ? Liquid around the nipples. ? Bleeding. ? Dimpling. ? Redness. ? A change in where the nipples are. Feel for changes  1. Lie on your back on the floor. 2. Feel each breast. To do this, follow these steps: ? Pick a breast to feel. ? Put the arm closest to that breast above your head. ? Use your other arm to feel the nipple area of your breast. Feel   the area with the pads of your three middle fingers by making small circles with your fingers. For the first circle, press lightly. For the second circle, press harder. For the third circle, press even harder. ? Keep making circles with your fingers at the different pressures as you move down your breast. Stop when you feel your ribs. ? Move your fingers a little toward the center of your body. ? Start making circles with your fingers again, this time going up until you reach your collarbone. ? Keep making up-and-down circles until you reach your armpit. Remember to keep using the three pressures. ? Feel the other breast in the same way. 3. Sit or stand in the tub or shower. 4. With soapy water on your skin, feel each breast the same way you did in step 2 when you were lying on the floor. Write down what you find Writing down what you find can help you remember what to tell your doctor. Write down:  What is normal for each breast.  Any  changes you find in each breast, including: ? The kind of changes you find. ? Whether you have pain. ? Size and location of any lumps.  When you last had your menstrual period. General tips  Check your breasts every month.  If you are breastfeeding, the best time to check your breasts is after you feed your baby or after you use a breast pump.  If you get menstrual periods, the best time to check your breasts is 5-7 days after your menstrual period is over.  With time, you will become comfortable with the self-exam, and you will begin to know if there are changes in your breasts. Contact a doctor if you:  See a change in the shape or size of your breasts or nipples.  See a change in the skin of your breast or nipples, such as red or scaly skin.  Have fluid coming from your nipples that is not normal.  Find a lump or thick area that was not there before.  Have pain in your breasts.  Have any concerns about your breast health. Summary  Breast self-awareness includes looking for changes in your breasts, as well as feeling for changes within your breasts.  Breast self-awareness should be done in front of a mirror in a well-lit room.  You should check your breasts every month. If you get menstrual periods, the best time to check your breasts is 5-7 days after your menstrual period is over.  Let your doctor know of any changes you see in your breasts, including changes in size, changes on the skin, pain or tenderness, or fluid from your nipples that is not normal. This information is not intended to replace advice given to you by your health care provider. Make sure you discuss any questions you have with your health care provider. Document Revised: 10/20/2017 Document Reviewed: 10/20/2017 Elsevier Patient Education  2020 Elsevier Inc.  

## 2020-01-02 NOTE — Progress Notes (Signed)
HPI:      [name] is a 29 y.o. [id number] who LMP was Patient's last menstrual period was 10/25/2017 (exact date).  Subjective:   She presents today for her annual examination.  She has additional complaints and concerns today. She moved out of the area and has not been here for a while but says she was seeing a GYN during this time. Patient states that she discontinued her Zoloft because "it was not working.  She says that she often has bad days and occasionally does not want to get out of bed for the whole day.  She states that she has begun counseling (1 session so far) but believes that she needs a different medication for her depression. She also states that she has had issues with her teeth and has had to take antibiotics over the course of at least 3 weeks and believes that she may have a yeast infection.  She complains of significant vulvar itching and burning for the last several weeks. She complains of decreased sexual desire and feels like maybe her "ovaries are not working" since her hysterectomy.    Hx: The following portions of the patient's history were reviewed and updated as appropriate:             She  has a past medical history of Anxiety, History of gestational diabetes, History of pre-eclampsia, and Migraines. She does not have any pertinent problems on file. She  has a past surgical history that includes Cholecystectomy; Induced abortion (09/11/2017); Tonsillectomy; Laparoscopic assisted vaginal hysterectomy (N/A, 11/23/2017); and Bilateral salpingectomy (Bilateral, 11/23/2017). Her family history includes Endometriosis in her maternal aunt and mother; Ovarian cancer in her mother. She  reports that she quit smoking about 4 years ago. Her smoking use included cigarettes. She has a 1.75 pack-year smoking history. She has never used smokeless tobacco. She reports that she does not drink alcohol and does not use drugs. She has a current medication list which includes the  following prescription(s): clindamycin, fluconazole, and paroxetine. She has No Known Allergies.       Review of Systems:  Review of Systems  Constitutional: Denied constitutional symptoms, night sweats, recent illness, fatigue, fever, insomnia and weight loss.  Eyes: Denied eye symptoms, eye pain, photophobia, vision change and visual disturbance.  Ears/Nose/Throat/Neck: Denied ear, nose, throat or neck symptoms, hearing loss, nasal discharge, sinus congestion and sore throat.  Cardiovascular: Denied cardiovascular symptoms, arrhythmia, chest pain/pressure, edema, exercise intolerance, orthopnea and palpitations.  Respiratory: Denied pulmonary symptoms, asthma, pleuritic pain, productive sputum, cough, dyspnea and wheezing.  Gastrointestinal: Denied, gastro-esophageal reflux, melena, nausea and vomiting.  Genitourinary: See HPI for additional information.  Musculoskeletal: Denied musculoskeletal symptoms, stiffness, swelling, muscle weakness and myalgia.  Dermatologic: Denied dermatology symptoms, rash and scar.  Neurologic: Denied neurology symptoms, dizziness, headache, neck pain and syncope.  Psychiatric: See HPI for additional information.  Endocrine: Denied endocrine symptoms including hot flashes and night sweats.   Meds:   Current Outpatient Medications on File Prior to Visit  Medication Sig Dispense Refill  . clindamycin (CLEOCIN) 300 MG capsule Take 300 mg by mouth 4 (four) times daily.     No current facility-administered medications on file prior to visit.          Objective:     Vitals:   01/02/20 0959  BP: 129/84  Pulse: 85    Filed Weights   01/02/20 0959  Weight: 244 lb 8 oz (110.9 kg)  Physical examination General NAD, Conversant  HEENT Atraumatic; Op clear with mmm.  Normo-cephalic. Pupils reactive. Anicteric sclerae  Thyroid/Neck Smooth without nodularity or enlargement. Normal ROM.  Neck Supple.  Skin No rashes, lesions or ulceration.  Normal palpated skin turgor. No nodularity.  Breasts: No masses or discharge.  Symmetric.  No axillary adenopathy.  Lungs: Clear to auscultation.No rales or wheezes. Normal Respiratory effort, no retractions.  Heart: NSR.  No murmurs or rubs appreciated. No periferal edema  Abdomen: Soft.  Non-tender.  No masses.  No HSM. No hernia  Extremities: Moves all appropriately.  Normal ROM for age. No lymphadenopathy.  Neuro: Oriented to PPT.  Normal mood. Normal affect.     Pelvic:   Vulva:  Significant erythema from the anus to the clitoris.  Most likely consistent with chronic monilia infection  Vagina: No lesions or abnormalities noted.  Thick white vaginal discharge  Support: Normal pelvic support.  Urethra No masses tenderness or scarring.  Meatus Normal size without lesions or prolapse.  Cervix: Surgically absent   Anus: Normal exam.  No lesions.  Perineum: Normal exam.  No lesions.        Bimanual   Uterus: Surgically absent   Adnexae: No masses.  Non-tender to palpation.  Cul-de-sac: Negative for abnormality.     Assessment:    [id number] Patient Active Problem List   Diagnosis Date Noted  . DUB (dysfunctional uterine bleeding) 11/23/2017  . Gestational diabetes mellitus (GDM) controlled on oral hypoglycemic drug 06/27/2016  . Excessive fetal growth affecting management of mother in third trimester, antepartum 06/25/2016  . Labor and delivery, indication for care 06/24/2016  . Indication for care in labor or delivery 06/17/2016  . Group B Streptococcus carrier, +RV culture, currently pregnant 06/17/2016  . Supervision of high risk elderly multigravida in third trimester 06/17/2016  . Gestational diabetes mellitus (GDM) in third trimester 04/24/2016  . Sleep disturbances 02/20/2016  . Chlamydia infection affecting pregnancy in first trimester 01/27/2016  . Obesity in pregnancy 12/28/2015  . H/O gestational diabetes in prior pregnancy, currently pregnant 12/28/2015  . H/O  pre-eclampsia in prior pregnancy, currently pregnant 12/28/2015  . Tobacco use disorder, mild, in sustained remission 12/28/2015     1. Encounter for well woman exam with routine gynecological exam   2. Depression, unspecified depression type   3. Vaginal itching   4. Decreased libido   5. 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  Patient states that she has ongoing depression not previously helped by Zoloft and wants to try something else.  Decreased libido possibly secondary to both of the above.   Plan:            1.  Basic Screening Recommendations The basic screening recommendations for asymptomatic women were discussed with the patient during her visit.  The age-appropriate recommendations were discussed with her and the rational for the tests reviewed.  When I am informed by the patient that another primary care physician has previously obtained the age-appropriate tests and they are up-to-date, only outstanding tests are ordered and referrals given as necessary.  Abnormal results of tests will be discussed with her when all of her results are completed.  Routine preventative health maintenance measures emphasized: Exercise/Diet/Weight control, Tobacco Warnings, Alcohol/Substance use risks and Stress Management TSH, FSH, A1c, lipids ordered. 2.  4 weeks of Diflucan for chronic monilia.  Use of topical antifungals also discussed and recommended. 3.  Begin Paxil for  depression.  Strongly advised patient to continue counseling.  Orders Orders Placed This Encounter  Procedures  . TSH  . Follicle stimulating hormone  . Lipid panel  . Hemoglobin A1c     Meds ordered this encounter  Medications  . PARoxetine (PAXIL) 10 MG tablet    Sig: Take 1 tablet (10 mg total) by mouth daily.    Dispense:  30 tablet    Refill:  1  . DISCONTD: fluconazole (DIFLUCAN) 150 MG tablet    Sig: Take 1 tablet (150 mg total) by  mouth every 3 (three) days. For three doses    Dispense:  3 tablet    Refill:  3  . fluconazole (DIFLUCAN) 150 MG tablet    Sig: Take 1 tablet (150 mg total) by mouth once a week for 5 doses. Take one pill today and one in 3 days then weekly as directed    Dispense:  5 tablet    Refill:  0            F/U  Return in about 6 weeks (around 02/13/2020). I spent 21 additional minutes involved in the care of this patient because of her significant other issues present at the time of "well woman "exam .  Depression, decreased libido, chronic monilia, climacteric type symptoms.  This involved preparing to see the patient by obtaining and reviewing her medical history (including labs, imaging tests and prior procedures), documenting clinical information in the electronic health record (EHR), counseling and coordinating care plans, writing and sending prescriptions, ordering tests or procedures and directly communicating with the patient by discussing pertinent items from her history and physical exam as well as detailing my assessment and plan as noted above so that she has an informed understanding.  All of her questions were answered.  [name], M.D. 01/02/2020 10:52 AM

## 2020-01-03 LAB — LIPID PANEL
Chol/HDL Ratio: 5.3 ratio — ABNORMAL HIGH (ref 0.0–4.4)
Cholesterol, Total: 171 mg/dL (ref 100–199)
HDL: 32 mg/dL — ABNORMAL LOW (ref >39)
LDL Chol Calc (NIH): 84 mg/dL (ref 0–99)
Triglycerides: 333 mg/dL — ABNORMAL HIGH (ref 0–149)
VLDL Cholesterol Cal: 55 mg/dL — ABNORMAL HIGH (ref 5–40)

## 2020-01-03 LAB — HEMOGLOBIN A1C
Est. average glucose Bld gHb Est-mCnc: 303 mg/dL
Hgb A1c MFr Bld: 12.2 % — ABNORMAL HIGH (ref 4.8–5.6)

## 2020-01-03 LAB — TSH: TSH: 1.61 u[IU]/mL (ref 0.450–4.500)

## 2020-01-03 LAB — FOLLICLE STIMULATING HORMONE: FSH: 5.9 m[IU]/mL

## 2020-01-03 NOTE — Telephone Encounter (Signed)
Will you please check on this tomorrow? I called and was hold for over 10 minutes and no one picked up. Thanks [employer]

## 2020-01-03 NOTE — Telephone Encounter (Signed)
Called pharmacy no answer held phone for over 10 minutes no one picked up.

## 2020-01-04 NOTE — Telephone Encounter (Signed)
Spoke with pharmacy on corrections to medication. Should have dispensed 5 tablets.

## 2020-02-14 ENCOUNTER — Encounter: Payer: Medicaid Other | Admitting: Obstetrics and Gynecology

## 2020-02-21 ENCOUNTER — Other Ambulatory Visit: Payer: Self-pay | Admitting: Obstetrics and Gynecology

## 2020-02-21 DIAGNOSIS — F32A Depression, unspecified: Secondary | ICD-10-CM

## 2020-02-21 DIAGNOSIS — R6882 Decreased libido: Secondary | ICD-10-CM

## 2020-02-28 ENCOUNTER — Ambulatory Visit (INDEPENDENT_AMBULATORY_CARE_PROVIDER_SITE_OTHER): Payer: Medicaid Other | Admitting: Obstetrics and Gynecology

## 2020-02-28 ENCOUNTER — Other Ambulatory Visit: Payer: Self-pay

## 2020-02-28 ENCOUNTER — Encounter: Payer: Self-pay | Admitting: Obstetrics and Gynecology

## 2020-02-28 VITALS — BP 121/70 | HR 76 | Ht 68.0 in | Wt 236.0 lb

## 2020-02-28 DIAGNOSIS — B373 Candidiasis of vulva and vagina: Secondary | ICD-10-CM | POA: Diagnosis not present

## 2020-02-28 DIAGNOSIS — N898 Other specified noninflammatory disorders of vagina: Secondary | ICD-10-CM

## 2020-02-28 DIAGNOSIS — F32A Depression, unspecified: Secondary | ICD-10-CM | POA: Diagnosis not present

## 2020-02-28 DIAGNOSIS — R6882 Decreased libido: Secondary | ICD-10-CM | POA: Diagnosis not present

## 2020-02-28 DIAGNOSIS — B3731 Acute candidiasis of vulva and vagina: Secondary | ICD-10-CM

## 2020-02-28 MED ORDER — PAROXETINE HCL 10 MG PO TABS: 10.0000 mg | ORAL_TABLET | Freq: Every day | ORAL | 0 refills | Status: DC

## 2020-02-28 MED ORDER — FLUCONAZOLE 150 MG PO TABS: 150.0000 mg | ORAL_TABLET | ORAL | 0 refills | Status: DC

## 2020-02-28 NOTE — Progress Notes (Signed)
HPI:      Marie Palmer is a 29 y.o. [phone] who LMP was Patient's last menstrual period was 10/25/2017 (exact date).  Subjective:   She presents today for follow-up of her depression and symptoms.  She reports that the Paxil is working much better than the Zoloft and she would like to continue it.  She says that she has also started counseling and reports this is going well. Patient does state that she used the medication for monilia and it was effective.  Approximately 2 to 3 weeks after stopping it she began to again experience vulvar itching and burning-the same type of symptoms. She can think of no cause for this sudden increase in yeast infections.  However upon further questioning she has begun taking more baths. Patient denies new sexual partner-has no concerns regarding STDs.    Hx: The following portions of the patient's history were reviewed and updated as appropriate:             She  has a past medical history of Anxiety, History of gestational diabetes, History of pre-eclampsia, and Migraines. She does not have any pertinent problems on file. She  has a past surgical history that includes Cholecystectomy; Induced abortion (09/11/2017); Tonsillectomy; Laparoscopic assisted vaginal hysterectomy (N/A, 11/23/2017); and Bilateral salpingectomy (Bilateral, 11/23/2017). Her family history includes Endometriosis in her maternal aunt and mother; Ovarian cancer in her mother. She  reports that she quit smoking about 4 years ago. Her smoking use included cigarettes. She has a 1.75 pack-year smoking history. She has never used smokeless tobacco. She reports that she does not drink alcohol and does not use drugs. She has a current medication list which includes the following prescription(s): fluconazole and paroxetine. She has No Known Allergies.       Review of Systems:  Review of Systems  Constitutional: Denied constitutional symptoms, night sweats, recent illness, fatigue, fever, insomnia  and weight loss.  Eyes: Denied eye symptoms, eye pain, photophobia, vision change and visual disturbance.  Ears/Nose/Throat/Neck: Denied ear, nose, throat or neck symptoms, hearing loss, nasal discharge, sinus congestion and sore throat.  Cardiovascular: Denied cardiovascular symptoms, arrhythmia, chest pain/pressure, edema, exercise intolerance, orthopnea and palpitations.  Respiratory: Denied pulmonary symptoms, asthma, pleuritic pain, productive sputum, cough, dyspnea and wheezing.  Gastrointestinal: Denied, gastro-esophageal reflux, melena, nausea and vomiting.  Genitourinary: See HPI for additional information.  Musculoskeletal: Denied musculoskeletal symptoms, stiffness, swelling, muscle weakness and myalgia.  Dermatologic: Denied dermatology symptoms, rash and scar.  Neurologic: Denied neurology symptoms, dizziness, headache, neck pain and syncope.  Psychiatric: See HPI for additional information.  Endocrine: Denied endocrine symptoms including hot flashes and night sweats.   Meds:   No current outpatient medications on file prior to visit.   No current facility-administered medications on file prior to visit.          Objective:     Vitals:   02/28/20 0954  BP: 121/70  Pulse: 76   Filed Weights   02/28/20 0954  Weight: 236 lb (107 kg)              Physical examination   Pelvic:  Vulva: Normal appearance.  No lesions.  Erythema with white exudate noted.  Excoriated areas.  Vagina: No lesions or abnormalities noted.  Support: Normal pelvic support.  Urethra No masses tenderness or scarring.  Meatus Normal size without lesions or prolapse.  Cervix: Normal appearance.  No lesions.  Anus: Normal exam.  No lesions.  Perineum: Normal exam.  No lesions.  WET PREP: clue cells: absent, KOH (yeast): positive, odor: absent and trichomoniasis: negative Ph:  < 4.5   Assessment:    [id number] Patient Active Problem List   Diagnosis Date Noted  . DUB (dysfunctional  uterine bleeding) 11/23/2017  . Gestational diabetes mellitus (GDM) controlled on oral hypoglycemic drug 06/27/2016  . Excessive fetal growth affecting management of mother in third trimester, antepartum 06/25/2016  . Labor and delivery, indication for care 06/24/2016  . Indication for care in labor or delivery 06/17/2016  . Group B Streptococcus carrier, +RV culture, currently pregnant 06/17/2016  . Supervision of high risk elderly multigravida in third trimester 06/17/2016  . Gestational diabetes mellitus (GDM) in third trimester 04/24/2016  . Sleep disturbances 02/20/2016  . Chlamydia infection affecting pregnancy in first trimester 01/27/2016  . Obesity in pregnancy 12/28/2015  . H/O gestational diabetes in prior pregnancy, currently pregnant 12/28/2015  . H/O pre-eclampsia in prior pregnancy, currently pregnant 12/28/2015  . Tobacco use disorder, mild, in sustained remission 12/28/2015     1. Depression, unspecified depression type   2. Vaginal itching   3. Monilial vulvovaginitis   4. Decreased libido     Depression much improved on Paxil  Recurrence of monilia vulvovaginitis.   Plan:            1.  Continue Paxil  2.  Diflucan for monilia -we have also discussed bubblebaths and other possible causes of monilia  3.  Continue counseling for depression Orders No orders of the defined types were placed in this encounter.    Meds ordered this encounter  Medications  . fluconazole (DIFLUCAN) 150 MG tablet    Sig: Take 1 tablet (150 mg total) by mouth every 3 (three) days. For 2 doses then weekly    Dispense:  4 tablet    Refill:  0  . PARoxetine (PAXIL) 10 MG tablet    Sig: Take 1 tablet (10 mg total) by mouth daily.    Dispense:  90 tablet    Refill:  0      F/U  Return in about 3 months (around 05/28/2020). I spent 24 minutes involved in the care of this patient preparing to see the patient by obtaining and reviewing her medical history (including labs, imaging tests  and prior procedures), documenting clinical information in the electronic health record (EHR), counseling and coordinating care plans, writing and sending prescriptions, ordering tests or procedures and directly communicating with the patient by discussing pertinent items from her history and physical exam as well as detailing my assessment and plan as noted above so that she has an informed understanding.  All of her questions were answered.  [name], M.D. 02/28/2020 10:36 AM

## 2020-03-27 ENCOUNTER — Other Ambulatory Visit: Payer: Self-pay | Admitting: Obstetrics and Gynecology

## 2020-03-27 DIAGNOSIS — R6882 Decreased libido: Secondary | ICD-10-CM

## 2020-03-27 DIAGNOSIS — F32A Depression, unspecified: Secondary | ICD-10-CM

## 2020-04-12 ENCOUNTER — Other Ambulatory Visit (HOSPITAL_COMMUNITY)
Admission: RE | Admit: 2020-04-12 | Discharge: 2020-04-12 | Disposition: A | Discharge status: Home / Self Care | Payer: Medicaid Other | Source: Ambulatory Visit | Attending: Obstetrics and Gynecology | Admitting: Obstetrics and Gynecology

## 2020-04-12 ENCOUNTER — Other Ambulatory Visit: Payer: Self-pay

## 2020-04-12 ENCOUNTER — Ambulatory Visit (INDEPENDENT_AMBULATORY_CARE_PROVIDER_SITE_OTHER): Payer: Medicaid Other | Admitting: Obstetrics and Gynecology

## 2020-04-12 ENCOUNTER — Encounter: Payer: Self-pay | Admitting: Obstetrics and Gynecology

## 2020-04-12 VITALS — BP 121/82 | HR 73 | Ht 68.0 in | Wt 232.7 lb

## 2020-04-12 DIAGNOSIS — N898 Other specified noninflammatory disorders of vagina: Secondary | ICD-10-CM | POA: Diagnosis not present

## 2020-04-12 MED ORDER — FLUCONAZOLE 150 MG PO TABS: 150.0000 mg | ORAL_TABLET | ORAL | 0 refills | Status: DC

## 2020-04-12 NOTE — Addendum Note (Signed)
Addended by: Dorian Pod on: 04/12/2020 09:43 AM   Modules accepted: Orders

## 2020-04-12 NOTE — Progress Notes (Addendum)
HPI:      [name] is a 30 y.o. [phone] who LMP was Patient's last menstrual period was 10/25/2017 (exact date).  Subjective:   She presents today stating that she took all of her medicine for yeast (Diflucan) and she felt like a "new woman".  However, very soon after stopping the medication she began to have the same symptoms of itching.  She presents today to be rechecked for a yeast infection.  She says that she is not particularly concerned about STDs however she "doesn't mind being checked." She has stopped taking baths and says she can identify no reason why she should be getting recurrent yeast infections.  She does state that at the end of last year she was on antibiotics for 2 months for a mouth/tooth issue.   Hx: The following portions of the patient's history were reviewed and updated as appropriate:             She  has a past medical history of Anxiety, History of gestational diabetes, History of pre-eclampsia, and Migraines. She does not have any pertinent problems on file. She  has a past surgical history that includes Cholecystectomy; Induced abortion (09/11/2017); Tonsillectomy; Laparoscopic assisted vaginal hysterectomy (N/A, 11/23/2017); and Bilateral salpingectomy (Bilateral, 11/23/2017). Her family history includes Endometriosis in her maternal aunt and mother; Ovarian cancer in her mother. She  reports that she quit smoking about 4 years ago. Her smoking use included cigarettes. She has a 1.75 pack-year smoking history. She has never used smokeless tobacco. She reports that she does not drink alcohol and does not use drugs. She has a current medication list which includes the following prescription(s): fluconazole and paroxetine. She has No Known Allergies.       Review of Systems:  Review of Systems  Constitutional: Denied constitutional symptoms, night sweats, recent illness, fatigue, fever, insomnia and weight loss.  Eyes: Denied eye symptoms, eye pain,  photophobia, vision change and visual disturbance.  Ears/Nose/Throat/Neck: Denied ear, nose, throat or neck symptoms, hearing loss, nasal discharge, sinus congestion and sore throat.  Cardiovascular: Denied cardiovascular symptoms, arrhythmia, chest pain/pressure, edema, exercise intolerance, orthopnea and palpitations.  Respiratory: Denied pulmonary symptoms, asthma, pleuritic pain, productive sputum, cough, dyspnea and wheezing.  Gastrointestinal: Denied, gastro-esophageal reflux, melena, nausea and vomiting.  Genitourinary: See HPI for additional information.  Musculoskeletal: Denied musculoskeletal symptoms, stiffness, swelling, muscle weakness and myalgia.  Dermatologic: Denied dermatology symptoms, rash and scar.  Neurologic: Denied neurology symptoms, dizziness, headache, neck pain and syncope.  Psychiatric: Denied psychiatric symptoms, anxiety and depression.  Endocrine: Denied endocrine symptoms including hot flashes and night sweats.   Meds:   Current Outpatient Medications on File Prior to Visit  Medication Sig Dispense Refill  . PARoxetine (PAXIL) 10 MG tablet TAKE 1 TABLET(10 MG) BY MOUTH DAILY 30 tablet 0   No current facility-administered medications on file prior to visit.          Objective:     Vitals:   04/12/20 0850  BP: 121/82  Pulse: 73   Filed Weights   04/12/20 0850  Weight: 232 lb 11.2 oz (105.6 kg)              Physical examination   Pelvic:   Vulva: Normal appearance.  No lesions.  Erythema and some excoriation noted.  Vagina: No lesions or abnormalities noted.  Thick white vaginal discharge  Support: Normal pelvic support.  Urethra No masses tenderness or scarring.  Meatus Normal size without lesions or prolapse.  Cervix: Normal appearance.  No lesions.  Anus: Normal exam.  No lesions.  Perineum: Normal exam.  No lesions.        Bimanual   Uterus: Normal size.  Non-tender.  Mobile.  AV.  Adnexae: No masses.  Non-tender to palpation.   Cul-de-sac: Negative for abnormality.     Assessment:    [id number] Patient Active Problem List   Diagnosis Date Noted  . DUB (dysfunctional uterine bleeding) 11/23/2017  . Gestational diabetes mellitus (GDM) controlled on oral hypoglycemic drug 06/27/2016  . Excessive fetal growth affecting management of mother in third trimester, antepartum 06/25/2016  . Labor and delivery, indication for care 06/24/2016  . Indication for care in labor or delivery 06/17/2016  . Group B Streptococcus carrier, +RV culture, currently pregnant 06/17/2016  . Supervision of high risk elderly multigravida in third trimester 06/17/2016  . Gestational diabetes mellitus (GDM) in third trimester 04/24/2016  . Sleep disturbances 02/20/2016  . Chlamydia infection affecting pregnancy in first trimester 01/27/2016  . Obesity in pregnancy 12/28/2015  . H/O gestational diabetes in prior pregnancy, currently pregnant 12/28/2015  . H/O pre-eclampsia in prior pregnancy, currently pregnant 12/28/2015  . Tobacco use disorder, mild, in sustained remission 12/28/2015     1. Vaginal itching     Based on the patient's symptoms we will assume it is a yeast infection again.   Plan:            1.  Nuswab sent to confirm diagnosis  2.  Diflucan for 4 weeks  3.  Discussed use of probiotics Orders No orders of the defined types were placed in this encounter.    Meds ordered this encounter  Medications  . DISCONTD: fluconazole (DIFLUCAN) 150 MG tablet    Sig: Take 1 tablet (150 mg total) by mouth once a week.    Dispense:  2 tablet    Refill:  0  . fluconazole (DIFLUCAN) 150 MG tablet    Sig: Take 1 tablet (150 mg total) by mouth once a week.    Dispense:  4 tablet    Refill:  0      F/U  No follow-ups on file. I spent 22 minutes involved in the care of this patient preparing to see the patient by obtaining and reviewing her medical history (including labs, imaging tests and prior procedures), documenting  clinical information in the electronic health record (EHR), counseling and coordinating care plans, writing and sending prescriptions, ordering tests or procedures and directly communicating with the patient by discussing pertinent items from her history and physical exam as well as detailing my assessment and plan as noted above so that she has an informed understanding.  All of her questions were answered.  [name], M.D. 04/12/2020 9:29 AM

## 2020-04-13 LAB — CERVICOVAGINAL ANCILLARY ONLY
Bacterial Vaginitis (gardnerella): NEGATIVE
Candida Glabrata: NEGATIVE
Candida Vaginitis: POSITIVE — AB
Chlamydia: NEGATIVE
Comment: NEGATIVE
Comment: NEGATIVE
Comment: NEGATIVE
Comment: NEGATIVE
Comment: NEGATIVE
Comment: NORMAL
Neisseria Gonorrhea: NEGATIVE
Trichomonas: NEGATIVE

## 2020-05-02 ENCOUNTER — Other Ambulatory Visit: Payer: Self-pay | Admitting: Obstetrics and Gynecology

## 2020-05-02 DIAGNOSIS — F32A Depression, unspecified: Secondary | ICD-10-CM

## 2020-05-02 DIAGNOSIS — R6882 Decreased libido: Secondary | ICD-10-CM

## 2020-05-10 ENCOUNTER — Encounter: Payer: Medicaid Other | Admitting: Obstetrics and Gynecology

## 2020-05-29 ENCOUNTER — Encounter: Payer: Medicaid Other | Admitting: Obstetrics and Gynecology

## 2020-06-05 ENCOUNTER — Other Ambulatory Visit: Payer: Self-pay | Admitting: Obstetrics and Gynecology

## 2020-06-05 DIAGNOSIS — R6882 Decreased libido: Secondary | ICD-10-CM

## 2020-06-05 DIAGNOSIS — F32A Depression, unspecified: Secondary | ICD-10-CM

## 2020-06-07 ENCOUNTER — Ambulatory Visit: Payer: Medicaid Other | Admitting: Obstetrics and Gynecology

## 2020-06-21 ENCOUNTER — Encounter: Payer: Self-pay | Admitting: Obstetrics and Gynecology

## 2020-06-26 ENCOUNTER — Ambulatory Visit (INDEPENDENT_AMBULATORY_CARE_PROVIDER_SITE_OTHER): Payer: Medicaid Other | Admitting: Obstetrics and Gynecology

## 2020-06-26 ENCOUNTER — Encounter: Payer: Self-pay | Admitting: Obstetrics and Gynecology

## 2020-06-26 ENCOUNTER — Other Ambulatory Visit: Payer: Self-pay

## 2020-06-26 VITALS — BP 117/81 | HR 83 | Ht 68.0 in | Wt 222.4 lb

## 2020-06-26 DIAGNOSIS — N3001 Acute cystitis with hematuria: Secondary | ICD-10-CM

## 2020-06-26 DIAGNOSIS — Z711 Person with feared health complaint in whom no diagnosis is made: Secondary | ICD-10-CM | POA: Diagnosis not present

## 2020-06-26 DIAGNOSIS — R300 Dysuria: Secondary | ICD-10-CM | POA: Diagnosis not present

## 2020-06-26 LAB — POCT URINALYSIS DIPSTICK OB
Bilirubin, UA: NEGATIVE
Glucose, UA: NEGATIVE
Ketones, UA: NEGATIVE
Nitrite, UA: POSITIVE
Spec Grav, UA: <=1.005 — AB (ref 1.010–1.025)
Urobilinogen, UA: 0.2 E.U./dL
pH, UA: 6.5 (ref 5.0–8.0)

## 2020-06-26 MED ORDER — NITROFURANTOIN MONOHYD MACRO 100 MG PO CAPS: 100.0000 mg | ORAL_CAPSULE | Freq: Two times a day (BID) | ORAL | 1 refills | Status: DC

## 2020-06-26 NOTE — Addendum Note (Signed)
Addended by: Dorian Pod on: 06/26/2020 01:53 PM   Modules accepted: Orders

## 2020-06-26 NOTE — Progress Notes (Signed)
HPI:      [name] is a 30 y.o. [phone] who LMP was Patient's last menstrual period was 10/25/2017 (exact date).  Subjective:   She presents today stating that over the last few days she has had significant dysuria, increased urine loss and pelvic pressure symptoms. She also states that she recently found out that her long-term boyfriend has been unfaithful to her.  They are no longer together.  She has concerns regarding STDs although she has no other symptoms other than bladder symptoms.  She does not know if he has any symptoms.    Hx: The following portions of the patient's history were reviewed and updated as appropriate:             She  has a past medical history of Anxiety, History of gestational diabetes, History of pre-eclampsia, and Migraines. She does not have any pertinent problems on file. She  has a past surgical history that includes Cholecystectomy; Induced abortion (09/11/2017); Tonsillectomy; Laparoscopic assisted vaginal hysterectomy (N/A, 11/23/2017); and Bilateral salpingectomy (Bilateral, 11/23/2017). Her family history includes Endometriosis in her maternal aunt and mother; Ovarian cancer in her mother. She  reports that she quit smoking about 4 years ago. Her smoking use included cigarettes. She has a 1.75 pack-year smoking history. She has never used smokeless tobacco. She reports that she does not drink alcohol and does not use drugs. She has a current medication list which includes the following prescription(s): nitrofurantoin (macrocrystal-monohydrate) and paroxetine. She has No Known Allergies.       Review of Systems:  Review of Systems  Constitutional: Denied constitutional symptoms, night sweats, recent illness, fatigue, fever, insomnia and weight loss.  Eyes: Denied eye symptoms, eye pain, photophobia, vision change and visual disturbance.  Ears/Nose/Throat/Neck: Denied ear, nose, throat or neck symptoms, hearing loss, nasal discharge, sinus congestion  and sore throat.  Cardiovascular: Denied cardiovascular symptoms, arrhythmia, chest pain/pressure, edema, exercise intolerance, orthopnea and palpitations.  Respiratory: Denied pulmonary symptoms, asthma, pleuritic pain, productive sputum, cough, dyspnea and wheezing.  Gastrointestinal: Denied, gastro-esophageal reflux, melena, nausea and vomiting.  Genitourinary: See HPI for additional information.  Musculoskeletal: Denied musculoskeletal symptoms, stiffness, swelling, muscle weakness and myalgia.  Dermatologic: Denied dermatology symptoms, rash and scar.  Neurologic: Denied neurology symptoms, dizziness, headache, neck pain and syncope.  Psychiatric: Denied psychiatric symptoms, anxiety and depression.  Endocrine: Denied endocrine symptoms including hot flashes and night sweats.   Meds:   Current Outpatient Medications on File Prior to Visit  Medication Sig Dispense Refill  . PARoxetine (PAXIL) 10 MG tablet TAKE 1 TABLET(10 MG) BY MOUTH DAILY 30 tablet 0   No current facility-administered medications on file prior to visit.          Objective:     Vitals:   06/26/20 0854  BP: 117/81  Pulse: 83   Filed Weights   06/26/20 0854  Weight: 222 lb 6.4 oz (100.9 kg)              Patient declined examination today.  She would like her urine used for STD testing.  Assessment:    [id number] Patient Active Problem List   Diagnosis Date Noted  . DUB (dysfunctional uterine bleeding) 11/23/2017  . Gestational diabetes mellitus (GDM) controlled on oral hypoglycemic drug 06/27/2016  . Excessive fetal growth affecting management of mother in third trimester, antepartum 06/25/2016  . Labor and delivery, indication for care 06/24/2016  . Indication for care in labor or delivery 06/17/2016  . Group B Streptococcus carrier, +  RV culture, currently pregnant 06/17/2016  . Supervision of high risk elderly multigravida in third trimester 06/17/2016  . Gestational diabetes mellitus (GDM) in  third trimester 04/24/2016  . Sleep disturbances 02/20/2016  . Chlamydia infection affecting pregnancy in first trimester 01/27/2016  . Obesity in pregnancy 12/28/2015  . H/O gestational diabetes in prior pregnancy, currently pregnant 12/28/2015  . H/O pre-eclampsia in prior pregnancy, currently pregnant 12/28/2015  . Tobacco use disorder, mild, in sustained remission 12/28/2015     1. Acute cystitis with hematuria   2. Dysuria   3. Concern about STD in female without diagnosis     UA consistent with UTI.   Plan:            1.  Macrobid for UTI.  Increased fluid intake, use of Azo discussed.  2.  After discussion regarding STDs patient has chosen to have her urine tested for STDs and her blood drawn.  Orders Orders Placed This Encounter  Procedures  . Viral Hepatitis HBV, HCV  . HIV Antibody (routine testing w rflx)  . RPR  . POC Urinalysis Dipstick OB     Meds ordered this encounter  Medications  . nitrofurantoin, macrocrystal-monohydrate, (MACROBID) 100 MG capsule    Sig: Take 1 capsule (100 mg total) by mouth 2 (two) times daily.    Dispense:  14 capsule    Refill:  1      F/U  No follow-ups on file. I spent 22 minutes involved in the care of this patient preparing to see the patient by obtaining and reviewing her medical history (including labs, imaging tests and prior procedures), documenting clinical information in the electronic health record (EHR), counseling and coordinating care plans, writing and sending prescriptions, ordering tests or procedures and directly communicating with the patient by discussing pertinent items from her history and physical exam as well as detailing my assessment and plan as noted above so that she has an informed understanding.  All of her questions were answered.  Marie Palmer, M.D. 06/26/2020 9:13 AM

## 2020-06-27 LAB — VIRAL HEPATITIS HBV, HCV
HCV Ab: <0.1 s/co ratio (ref 0.0–0.9)
Hep B Core Total Ab: NEGATIVE
Hep B Surface Ab, Qual: NONREACTIVE
Hepatitis B Surface Ag: NEGATIVE

## 2020-06-27 LAB — HIV ANTIBODY (ROUTINE TESTING W REFLEX): HIV Screen 4th Generation wRfx: NONREACTIVE

## 2020-06-27 LAB — GC/CHLAMYDIA PROBE AMP
Chlamydia trachomatis, NAA: NEGATIVE
Neisseria Gonorrhoeae by PCR: POSITIVE — AB

## 2020-06-27 LAB — HCV INTERPRETATION

## 2020-06-27 LAB — RPR: RPR Ser Ql: NONREACTIVE

## 2020-07-06 ENCOUNTER — Other Ambulatory Visit: Payer: Self-pay

## 2020-07-06 ENCOUNTER — Ambulatory Visit (INDEPENDENT_AMBULATORY_CARE_PROVIDER_SITE_OTHER): Payer: Medicaid Other

## 2020-07-06 DIAGNOSIS — Z202 Contact with and (suspected) exposure to infections with a predominantly sexual mode of transmission: Secondary | ICD-10-CM | POA: Diagnosis not present

## 2020-07-06 MED ORDER — CEFTRIAXONE SODIUM 500 MG IJ SOLR
500.0000 mg | Freq: Once | INTRAMUSCULAR | Status: AC
  Administered 2020-07-06: 500 mg via INTRAMUSCULAR

## 2020-07-06 NOTE — Progress Notes (Signed)
Pt present for std treatment.

## 2020-07-23 ENCOUNTER — Telehealth: Payer: Self-pay | Admitting: Obstetrics and Gynecology

## 2020-07-23 NOTE — Telephone Encounter (Signed)
Patient called in and states she still has a UTI and is still in pain.  Marie Palmer made an appointment because she states she is in so much pain.  [name] would like to know if she could get more Macrobid called in to [facility] on [address] in [place].  Please advise.

## 2020-07-24 ENCOUNTER — Other Ambulatory Visit (HOSPITAL_COMMUNITY)
Admission: RE | Admit: 2020-07-24 | Discharge: 2020-07-24 | Disposition: A | Discharge status: Home / Self Care | Payer: Medicaid Other | Source: Ambulatory Visit | Attending: Obstetrics and Gynecology | Admitting: Obstetrics and Gynecology

## 2020-07-24 ENCOUNTER — Ambulatory Visit (INDEPENDENT_AMBULATORY_CARE_PROVIDER_SITE_OTHER): Payer: Medicaid Other | Admitting: Obstetrics and Gynecology

## 2020-07-24 ENCOUNTER — Other Ambulatory Visit: Payer: Self-pay

## 2020-07-24 ENCOUNTER — Encounter: Payer: Self-pay | Admitting: Obstetrics and Gynecology

## 2020-07-24 VITALS — BP 130/78 | HR 99 | Ht 67.0 in | Wt 218.0 lb

## 2020-07-24 DIAGNOSIS — B029 Zoster without complications: Secondary | ICD-10-CM | POA: Diagnosis not present

## 2020-07-24 DIAGNOSIS — N898 Other specified noninflammatory disorders of vagina: Secondary | ICD-10-CM | POA: Insufficient documentation

## 2020-07-24 DIAGNOSIS — R300 Dysuria: Secondary | ICD-10-CM

## 2020-07-24 LAB — POCT URINALYSIS DIPSTICK
Bilirubin, UA: NEGATIVE
Blood, UA: NEGATIVE
Glucose, UA: NEGATIVE
Ketones, UA: NEGATIVE
Leukocytes, UA: NEGATIVE
Nitrite, UA: NEGATIVE
Protein, UA: POSITIVE — AB
Spec Grav, UA: 1.01 (ref 1.010–1.025)
Urobilinogen, UA: 0.2 E.U./dL
pH, UA: 6 (ref 5.0–8.0)

## 2020-07-24 MED ORDER — ACYCLOVIR 800 MG PO TABS: 800.0000 mg | ORAL_TABLET | Freq: Every day | ORAL | 1 refills | Status: DC

## 2020-07-24 NOTE — Progress Notes (Signed)
HPI:      [name] is a 30 y.o. [phone] who LMP was Patient's last menstrual period was 10/25/2017 (exact date).  Subjective:   She presents today with several complaints.  Because of the recent infidelity of her partner and subsequent diagnosis of gonorrhea she is struggling emotionally.  She also is having some physical issues which include vaginal discharge, " possibly a yeast infection", a cluster of blisters on her right thigh which itch/mild burning, and pressure with urination. Of significant note she was treated with ceftriaxone for gonorrhea.  She describes a childhood history of chickenpox. Patient has had a previous hysterectomy. She has not had sexual relations in more than 2 months.    Hx: The following portions of the patient's history were reviewed and updated as appropriate:             She  has a past medical history of Anxiety, History of gestational diabetes, History of pre-eclampsia, and Migraines. She does not have any pertinent problems on file. She  has a past surgical history that includes Cholecystectomy; Induced abortion (09/11/2017); Tonsillectomy; Laparoscopic assisted vaginal hysterectomy (N/A, 11/23/2017); and Bilateral salpingectomy (Bilateral, 11/23/2017). Her family history includes Endometriosis in her maternal aunt and mother; Ovarian cancer in her mother. She  reports that she quit smoking about 4 years ago. Her smoking use included cigarettes. She has a 1.75 pack-year smoking history. She has never used smokeless tobacco. She reports that she does not drink alcohol and does not use drugs. She has a current medication list which includes the following prescription(s): acyclovir and paroxetine. She has No Known Allergies.       Review of Systems:  Review of Systems  Constitutional: Denied constitutional symptoms, night sweats, recent illness, fatigue, fever, insomnia and weight loss.  Eyes: Denied eye symptoms, eye pain, photophobia, vision change and  visual disturbance.  Ears/Nose/Throat/Neck: Denied ear, nose, throat or neck symptoms, hearing loss, nasal discharge, sinus congestion and sore throat.  Cardiovascular: Denied cardiovascular symptoms, arrhythmia, chest pain/pressure, edema, exercise intolerance, orthopnea and palpitations.  Respiratory: Denied pulmonary symptoms, asthma, pleuritic pain, productive sputum, cough, dyspnea and wheezing.  Gastrointestinal: Denied, gastro-esophageal reflux, melena, nausea and vomiting.  Genitourinary: See HPI for additional information.  Musculoskeletal: Denied musculoskeletal symptoms, stiffness, swelling, muscle weakness and myalgia.  Dermatologic: Denied dermatology symptoms, rash and scar.  Neurologic: Denied neurology symptoms, dizziness, headache, neck pain and syncope.  Psychiatric: Denied psychiatric symptoms, anxiety and depression.  Endocrine: Denied endocrine symptoms including hot flashes and night sweats.   Meds:   Current Outpatient Medications on File Prior to Visit  Medication Sig Dispense Refill  . PARoxetine (PAXIL) 10 MG tablet TAKE 1 TABLET(10 MG) BY MOUTH DAILY 30 tablet 0   No current facility-administered medications on file prior to visit.          Objective:     Vitals:   07/24/20 1545  BP: 130/78  Pulse: 99   Filed Weights   07/24/20 1545  Weight: 218 lb (98.9 kg)              Examination of the right inner thigh reveals a spot approximately the size of a half dollar with erythema and a cluster of small vesicular lesions/blisters.  Physical examination   Pelvic:   Vulva: Normal appearance.  No lesions.  Vagina: No lesions or abnormalities noted.  Support: Normal pelvic support.  Urethra No masses tenderness or scarring.  Meatus Normal size without lesions or prolapse.  Cervix:  Surgically  absent  Anus: Normal exam.  No lesions.  Perineum: Normal exam.  No lesions.     Assessment:    [id number] Patient Active Problem List   Diagnosis Date  Noted  . DUB (dysfunctional uterine bleeding) 11/23/2017  . Gestational diabetes mellitus (GDM) controlled on oral hypoglycemic drug 06/27/2016  . Excessive fetal growth affecting management of mother in third trimester, antepartum 06/25/2016  . Labor and delivery, indication for care 06/24/2016  . Indication for care in labor or delivery 06/17/2016  . Group B Streptococcus carrier, +RV culture, currently pregnant 06/17/2016  . Supervision of high risk elderly multigravida in third trimester 06/17/2016  . Gestational diabetes mellitus (GDM) in third trimester 04/24/2016  . Sleep disturbances 02/20/2016  . Chlamydia infection affecting pregnancy in first trimester 01/27/2016  . Obesity in pregnancy 12/28/2015  . H/O gestational diabetes in prior pregnancy, currently pregnant 12/28/2015  . H/O pre-eclampsia in prior pregnancy, currently pregnant 12/28/2015  . Tobacco use disorder, mild, in sustained remission 12/28/2015     1. Dysuria   2. Herpes zoster without complication   3. Vaginal itching     Lesion on her right thigh appears more like shingles than HSV.  Recent course of antibiotics-patient may indeed have monilia vaginitis.   Plan:            1.  Nuswab sent  2.  HSV cultures of right thigh lesion sent  3.  Begin presumptive treatment for herpes zoster with Zovirax  Orders Orders Placed This Encounter  Procedures  . Herpes simplex virus culture  . POCT urinalysis dipstick     Meds ordered this encounter  Medications  . acyclovir (ZOVIRAX) 800 MG tablet    Sig: Take 1 tablet (800 mg total) by mouth 5 (five) times daily. FOR 7 DAYS    Dispense:  35 tablet    Refill:  1      F/U  No follow-ups on file. I spent 25 minutes involved in the care of this patient preparing to see the patient by obtaining and reviewing her medical history (including labs, imaging tests and prior procedures), documenting clinical information in the electronic health record (EHR),  counseling and coordinating care plans, writing and sending prescriptions, ordering tests or procedures and directly communicating with the patient by discussing pertinent items from her history and physical exam as well as detailing my assessment and plan as noted above so that she has an informed understanding.  All of her questions were answered.  [name], M.D. 07/24/2020 4:18 PM

## 2020-07-24 NOTE — Telephone Encounter (Signed)
Patient is scheduled to be seen today.

## 2020-07-26 LAB — CERVICOVAGINAL ANCILLARY ONLY
Bacterial Vaginitis (gardnerella): POSITIVE — AB
Candida Glabrata: NEGATIVE
Candida Vaginitis: POSITIVE — AB
Chlamydia: NEGATIVE
Comment: NEGATIVE
Comment: NEGATIVE
Comment: NEGATIVE
Comment: NEGATIVE
Comment: NEGATIVE
Comment: NORMAL
Neisseria Gonorrhea: NEGATIVE
Trichomonas: NEGATIVE

## 2020-07-26 LAB — HERPES SIMPLEX VIRUS CULTURE

## 2020-07-30 ENCOUNTER — Other Ambulatory Visit: Payer: Self-pay | Admitting: Surgical

## 2020-07-30 MED ORDER — METRONIDAZOLE 500 MG PO TABS: 500.0000 mg | ORAL_TABLET | Freq: Two times a day (BID) | ORAL | 0 refills | Status: DC

## 2020-07-30 MED ORDER — FLUCONAZOLE 150 MG PO TABS: 150.0000 mg | ORAL_TABLET | Freq: Every day | ORAL | 0 refills | Status: AC

## 2020-08-17 ENCOUNTER — Ambulatory Visit: Payer: Medicaid Other | Admitting: Obstetrics and Gynecology

## 2020-09-12 ENCOUNTER — Other Ambulatory Visit: Payer: Self-pay | Admitting: Obstetrics and Gynecology

## 2020-09-12 DIAGNOSIS — R6882 Decreased libido: Secondary | ICD-10-CM

## 2020-09-12 DIAGNOSIS — F32A Depression, unspecified: Secondary | ICD-10-CM

## 2020-10-09 ENCOUNTER — Encounter: Payer: Self-pay | Admitting: Obstetrics and Gynecology

## 2020-10-09 ENCOUNTER — Encounter: Payer: Medicaid Other | Admitting: Obstetrics and Gynecology

## 2021-09-26 ENCOUNTER — Encounter: Payer: Self-pay | Admitting: Advanced Practice Midwife

## 2021-09-26 ENCOUNTER — Ambulatory Visit: Payer: Medicaid Other | Admitting: Advanced Practice Midwife

## 2021-09-26 DIAGNOSIS — Z90710 Acquired absence of both cervix and uterus: Secondary | ICD-10-CM | POA: Insufficient documentation

## 2021-09-26 DIAGNOSIS — Z62810 Personal history of physical and sexual abuse in childhood: Secondary | ICD-10-CM | POA: Insufficient documentation

## 2021-09-26 DIAGNOSIS — T7421XA Adult sexual abuse, confirmed, initial encounter: Secondary | ICD-10-CM | POA: Insufficient documentation

## 2021-09-26 DIAGNOSIS — T7411XA Adult physical abuse, confirmed, initial encounter: Secondary | ICD-10-CM | POA: Insufficient documentation

## 2021-09-26 DIAGNOSIS — Z113 Encounter for screening for infections with a predominantly sexual mode of transmission: Secondary | ICD-10-CM | POA: Diagnosis not present

## 2021-09-26 DIAGNOSIS — F17200 Nicotine dependence, unspecified, uncomplicated: Secondary | ICD-10-CM

## 2021-09-26 DIAGNOSIS — Z202 Contact with and (suspected) exposure to infections with a predominantly sexual mode of transmission: Secondary | ICD-10-CM

## 2021-09-26 DIAGNOSIS — T7421XS Adult sexual abuse, confirmed, sequela: Secondary | ICD-10-CM

## 2021-09-26 DIAGNOSIS — F325 Major depressive disorder, single episode, in full remission: Secondary | ICD-10-CM | POA: Insufficient documentation

## 2021-09-26 DIAGNOSIS — T7411XS Adult physical abuse, confirmed, sequela: Secondary | ICD-10-CM

## 2021-09-26 LAB — WET PREP FOR TRICH, YEAST, CLUE
Trichomonas Exam: NEGATIVE
Yeast Exam: NEGATIVE

## 2021-09-26 LAB — HM HEPATITIS C SCREENING LAB: HM Hepatitis Screen: NEGATIVE

## 2021-09-26 LAB — HM HIV SCREENING LAB: HM HIV Screening: NEGATIVE

## 2021-09-26 MED ORDER — CEFTRIAXONE SODIUM 1 G IJ SOLR: 0.5000 g | Freq: Once | INTRAMUSCULAR | Status: DC

## 2021-09-26 MED ORDER — CEFTRIAXONE SODIUM 500 MG IJ SOLR
500.0000 mg | Freq: Once | INTRAMUSCULAR | Status: AC
  Administered 2021-09-26: 500 mg via INTRAMUSCULAR

## 2021-09-26 NOTE — Progress Notes (Signed)
WET PREP does not require treatment in clinic per [facility]. Medicated per standing orders for Gonorrhea contact. Pt walks in halls post-injection with no adverse reaction. Verbalized understanding of further labwork pending.  Lethea Killings RN
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
## 2021-10-14 ENCOUNTER — Ambulatory Visit
Admission: EM | Admit: 2021-10-14 | Discharge: 2021-10-14 | Disposition: A | Discharge status: Home / Self Care | Payer: Medicaid Other

## 2021-10-14 ENCOUNTER — Telehealth: Payer: Self-pay

## 2021-10-14 ENCOUNTER — Other Ambulatory Visit: Payer: Self-pay

## 2021-10-14 ENCOUNTER — Telehealth: Payer: Self-pay | Admitting: Obstetrics and Gynecology

## 2021-10-14 DIAGNOSIS — B029 Zoster without complications: Secondary | ICD-10-CM | POA: Diagnosis not present

## 2021-10-14 MED ORDER — ACYCLOVIR 800 MG PO TABS: 800.0000 mg | ORAL_TABLET | Freq: Every day | ORAL | 1 refills | Status: DC

## 2021-10-14 NOTE — Telephone Encounter (Signed)
Patient called in to request a refill of medication for last year when she states she had shingles. She states a "blister" on her inner thigh that is itching and burning. Patient states "everything is just like it was last year, I just need the same medication". I advised her that she should have a visit with urgent care to further evaluate her symptoms, she was not pleased with that response. Patient insisted I reach out to previous provider to ask for a refill of Acyclovir. She stated she would call around for an appointment with urgent care, advised most also have walk-in options, patient voiced understanding. Please advise
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
## 2021-10-14 NOTE — Discharge Instructions (Signed)
Today you are being treated for your rash  Take acyclovir every 4-5 hours for the next 7 days  You may use of topical calamine or Benadryl cream to help manage itching, you may also use oral Benadryl  Please avoid long exposures to heat such as a hot steamy shower or being outside as this may cause further irritation to your rash  You may follow-up with his urgent care as needed if symptoms persist or worsen

## 2021-10-14 NOTE — Telephone Encounter (Signed)
Spoke with patient. She states she has went to urgent care and been prescribed medication for her concern. While on the phone she was transferred to front office to schedule an annual exam.

## 2021-10-14 NOTE — Telephone Encounter (Signed)
Patient called and states she think she has shingles. Spoke with CMA, CMA states she needed to visit urgent care.

## 2021-10-14 NOTE — ED Triage Notes (Signed)
Pt presents with bumps on inner R thigh x 2 days.  States it is shingles and always appears here.  No rash on torso.

## 2021-10-16 ENCOUNTER — Ambulatory Visit: Payer: Medicaid Other | Admitting: Licensed Clinical Social Worker

## 2023-03-12 ENCOUNTER — Ambulatory Visit
Admission: RE | Admit: 2023-03-12 | Discharge: 2023-03-12 | Disposition: A | Discharge status: Home / Self Care | Payer: Medicaid Other | Source: Ambulatory Visit | Attending: Physician Assistant | Admitting: Physician Assistant

## 2023-03-12 VITALS — BP 147/103 | HR 82 | Temp 98.5°F | Ht 66.0 in | Wt 184.0 lb

## 2023-03-12 DIAGNOSIS — G43809 Other migraine, not intractable, without status migrainosus: Secondary | ICD-10-CM | POA: Diagnosis present

## 2023-03-12 DIAGNOSIS — R5383 Other fatigue: Secondary | ICD-10-CM | POA: Insufficient documentation

## 2023-03-12 DIAGNOSIS — R051 Acute cough: Secondary | ICD-10-CM | POA: Diagnosis present

## 2023-03-12 DIAGNOSIS — J069 Acute upper respiratory infection, unspecified: Secondary | ICD-10-CM | POA: Diagnosis present

## 2023-03-12 LAB — RESP PANEL BY RT-PCR (FLU A&B, COVID) ARPGX2
Influenza A by PCR: NEGATIVE
Influenza B by PCR: NEGATIVE
SARS Coronavirus 2 by RT PCR: NEGATIVE

## 2023-03-12 MED ORDER — KETOROLAC TROMETHAMINE 30 MG/ML IJ SOLN
30.0000 mg | Freq: Once | INTRAMUSCULAR | Status: AC
  Administered 2023-03-12: 30 mg via INTRAMUSCULAR

## 2023-03-12 MED ORDER — PROMETHAZINE-DM 6.25-15 MG/5ML PO SYRP: 5.0000 mL | ORAL_SOLUTION | Freq: Four times a day (QID) | ORAL | 0 refills | Status: DC | PRN

## 2023-03-12 MED ORDER — METOCLOPRAMIDE HCL 5 MG/ML IJ SOLN
10.0000 mg | Freq: Once | INTRAMUSCULAR | Status: AC
  Administered 2023-03-12: 10 mg via INTRAMUSCULAR

## 2023-03-12 NOTE — Discharge Instructions (Addendum)
-  Negative flu and COVID.  URI/COLD SYMPTOMS: Your exam today is consistent with a viral illness. Antibiotics are not indicated at this time. Use medications as directed, including cough syrup, nasal saline, and decongestants. Your symptoms should improve over the next few days and resolve within 7-10 days. Increase rest and fluids. F/u if symptoms worsen or predominate such as sore throat, ear pain, productive cough, shortness of breath, or if you develop high fevers or worsening fatigue over the next several days.

## 2023-03-12 NOTE — ED Triage Notes (Signed)
Pt is with her boyfriend  Pt c/o cough, fatigue, body chills  x2days
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
## 2023-03-31 ENCOUNTER — Ambulatory Visit: Payer: Self-pay

## 2023-07-01 ENCOUNTER — Ambulatory Visit
Admission: RE | Admit: 2023-07-01 | Discharge: 2023-07-01 | Disposition: A | Discharge status: Home / Self Care | Source: Ambulatory Visit | Attending: Family Medicine | Admitting: Family Medicine

## 2023-07-01 VITALS — BP 149/99 | HR 81 | Temp 99.0°F | Resp 16 | Ht 66.0 in | Wt 199.0 lb

## 2023-07-01 DIAGNOSIS — B009 Herpesviral infection, unspecified: Secondary | ICD-10-CM | POA: Insufficient documentation

## 2023-07-01 DIAGNOSIS — R739 Hyperglycemia, unspecified: Secondary | ICD-10-CM | POA: Diagnosis present

## 2023-07-01 DIAGNOSIS — Z202 Contact with and (suspected) exposure to infections with a predominantly sexual mode of transmission: Secondary | ICD-10-CM | POA: Insufficient documentation

## 2023-07-01 DIAGNOSIS — B029 Zoster without complications: Secondary | ICD-10-CM | POA: Insufficient documentation

## 2023-07-01 DIAGNOSIS — R030 Elevated blood-pressure reading, without diagnosis of hypertension: Secondary | ICD-10-CM | POA: Diagnosis present

## 2023-07-01 DIAGNOSIS — Z8632 Personal history of gestational diabetes: Secondary | ICD-10-CM | POA: Insufficient documentation

## 2023-07-01 LAB — BASIC METABOLIC PANEL WITH GFR
Anion gap: 11 (ref 5–15)
BUN: 11 mg/dL (ref 6–20)
CO2: 23 mmol/L (ref 22–32)
Calcium: 9.5 mg/dL (ref 8.9–10.3)
Chloride: 97 mmol/L — ABNORMAL LOW (ref 98–111)
Creatinine, Ser: 0.7 mg/dL (ref 0.44–1.00)
GFR, Estimated: >60 mL/min (ref >60)
Glucose, Bld: 378 mg/dL — ABNORMAL HIGH (ref 70–99)
Potassium: 3.8 mmol/L (ref 3.5–5.1)
Sodium: 131 mmol/L — ABNORMAL LOW (ref 135–145)

## 2023-07-01 LAB — GLUCOSE, CAPILLARY: Glucose-Capillary: 381 mg/dL — ABNORMAL HIGH (ref 70–99)

## 2023-07-01 LAB — HEMOGLOBIN A1C
Hgb A1c MFr Bld: 11 % — ABNORMAL HIGH (ref 4.8–5.6)
Mean Plasma Glucose: 269 mg/dL

## 2023-07-01 MED ORDER — ACYCLOVIR 800 MG PO TABS: 800.0000 mg | ORAL_TABLET | Freq: Every day | ORAL | 0 refills | Status: AC

## 2023-07-01 MED ORDER — PENICILLIN G BENZATHINE 1200000 UNIT/2ML IM SUSY
2.4000 10*6.[IU] | PREFILLED_SYRINGE | Freq: Once | INTRAMUSCULAR | Status: AC
  Administered 2023-07-01: 2.4 10*6.[IU] via INTRAMUSCULAR

## 2023-07-01 NOTE — ED Triage Notes (Signed)
 Pt presents to UC for STD. Denies any active sx's. States ex boyfriend tested + for syphilis yesterday.

## 2023-07-01 NOTE — Discharge Instructions (Addendum)
 You likely have diabetes and likely high blood pressure. Keep your new patient appointment with your new primary care provider.    Take the acyclovir for your thigh rash.    You should have a repeat syphilis test in 6 months.  Your STD test results will be available in the next 72 hours. If positive, someone will contact you.  You should see your results in your MyChart account.
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
## 2023-07-02 ENCOUNTER — Telehealth (HOSPITAL_COMMUNITY): Payer: Self-pay

## 2023-07-02 ENCOUNTER — Encounter: Payer: Self-pay | Admitting: Family Medicine

## 2023-07-02 LAB — CERVICOVAGINAL ANCILLARY ONLY
Chlamydia: NEGATIVE
Comment: NEGATIVE
Comment: NEGATIVE
Comment: NORMAL
Neisseria Gonorrhea: NEGATIVE
Trichomonas: POSITIVE — AB

## 2023-07-02 LAB — RPR: RPR Ser Ql: NONREACTIVE

## 2023-07-02 MED ORDER — METRONIDAZOLE 500 MG PO TABS: 500.0000 mg | ORAL_TABLET | Freq: Two times a day (BID) | ORAL | 0 refills | Status: DC

## 2023-07-02 MED ORDER — METFORMIN HCL 500 MG PO TABS
500.0000 mg | ORAL_TABLET | Freq: Two times a day (BID) | ORAL | 0 refills | Status: AC
Start: 2023-07-02 — End: ?

## 2023-07-02 MED ORDER — METRONIDAZOLE 500 MG PO TABS: 500.0000 mg | ORAL_TABLET | Freq: Two times a day (BID) | ORAL | 0 refills | Status: AC

## 2023-07-02 MED ORDER — METFORMIN HCL 500 MG PO TABS: 500.0000 mg | ORAL_TABLET | Freq: Two times a day (BID) | ORAL | 0 refills | Status: DC

## 2023-07-02 NOTE — Addendum Note (Signed)
 Addended by: Sharilyn Davenport on: 07/02/2023 04:22 PM   Modules accepted: Orders

## 2023-07-02 NOTE — Telephone Encounter (Signed)
 Per B. Baniter, MD, "let's start her on metformin. Please send rx for metformin 500 mg, #60, no refill, 1 po BID. Have her start with one daily with breakfast for about 3 days, then increase to BID with breakfast and supper(to hopefully help with GI upset SE). thanks ."  Per protocol, pt requires tx with metronidazole. Reviewed with patient, verified pharmacy, prescription sent  Contacted patient by phone.  Verified identity using two identifiers.  Provided positive result.  Reviewed safe sex practices, notifying partners, and refraining from sexual activities for 7 days from time of treatment.  Patient verified understanding, all questions answered.

## 2023-07-03 LAB — MISC LABCORP TEST (SEND OUT): Labcorp test code: 83935

## 2023-07-21 ENCOUNTER — Ambulatory Visit: Admitting: Student

## 2023-09-29 ENCOUNTER — Emergency Department

## 2023-09-29 ENCOUNTER — Emergency Department
Admission: EM | Admit: 2023-09-29 | Discharge: 2023-09-29 | Disposition: A | Discharge status: Home / Self Care | Attending: Emergency Medicine | Admitting: Emergency Medicine

## 2023-09-29 ENCOUNTER — Other Ambulatory Visit: Payer: Self-pay

## 2023-09-29 DIAGNOSIS — E1165 Type 2 diabetes mellitus with hyperglycemia: Secondary | ICD-10-CM | POA: Insufficient documentation

## 2023-09-29 DIAGNOSIS — R739 Hyperglycemia, unspecified: Secondary | ICD-10-CM

## 2023-09-29 DIAGNOSIS — Z7984 Long term (current) use of oral hypoglycemic drugs: Secondary | ICD-10-CM | POA: Diagnosis not present

## 2023-09-29 DIAGNOSIS — R519 Headache, unspecified: Secondary | ICD-10-CM | POA: Diagnosis not present

## 2023-09-29 LAB — CBG MONITORING, ED
Glucose-Capillary: 331 mg/dL — ABNORMAL HIGH (ref 70–99)
Glucose-Capillary: 254 mg/dL — ABNORMAL HIGH (ref 70–99)

## 2023-09-29 LAB — COMPREHENSIVE METABOLIC PANEL WITH GFR
ALT: 19 U/L (ref 0–44)
AST: 23 U/L (ref 15–41)
Albumin: 4 g/dL (ref 3.5–5.0)
Alkaline Phosphatase: 62 U/L (ref 38–126)
Anion gap: 13 (ref 5–15)
BUN: 11 mg/dL (ref 6–20)
CO2: 22 mmol/L (ref 22–32)
Calcium: 9.3 mg/dL (ref 8.9–10.3)
Chloride: 100 mmol/L (ref 98–111)
Creatinine, Ser: 0.64 mg/dL (ref 0.44–1.00)
GFR, Estimated: >60 mL/min (ref >60)
Glucose, Bld: 337 mg/dL — ABNORMAL HIGH (ref 70–99)
Potassium: 3.7 mmol/L (ref 3.5–5.1)
Sodium: 135 mmol/L (ref 135–145)
Total Bilirubin: 0.7 mg/dL (ref 0.0–1.2)
Total Protein: 6.8 g/dL (ref 6.5–8.1)

## 2023-09-29 LAB — TROPONIN I (HIGH SENSITIVITY): Troponin I (High Sensitivity): 3 ng/L (ref <18)

## 2023-09-29 LAB — CBC WITH DIFFERENTIAL/PLATELET
Abs Immature Granulocytes: 0.03 K/uL (ref 0.00–0.07)
Basophils Absolute: 0.1 K/uL (ref 0.0–0.1)
Basophils Relative: 1 %
Eosinophils Absolute: 0.2 K/uL (ref 0.0–0.5)
Eosinophils Relative: 2 %
HCT: 43.6 % (ref 36.0–46.0)
Hemoglobin: 15.7 g/dL — ABNORMAL HIGH (ref 12.0–15.0)
Immature Granulocytes: 0 %
Lymphocytes Relative: 34 %
Lymphs Abs: 3.5 K/uL (ref 0.7–4.0)
MCH: 30.4 pg (ref 26.0–34.0)
MCHC: 36 g/dL (ref 30.0–36.0)
MCV: 84.3 fL (ref 80.0–100.0)
Monocytes Absolute: 0.6 K/uL (ref 0.1–1.0)
Monocytes Relative: 6 %
Neutro Abs: 5.9 K/uL (ref 1.7–7.7)
Neutrophils Relative %: 57 %
Platelets: 316 K/uL (ref 150–400)
RBC: 5.17 MIL/uL — ABNORMAL HIGH (ref 3.87–5.11)
RDW: 12 % (ref 11.5–15.5)
WBC: 10.3 K/uL (ref 4.0–10.5)
nRBC: 0 % (ref 0.0–0.2)

## 2023-09-29 LAB — HEMOGLOBIN A1C
Hgb A1c MFr Bld: 11.3 % — ABNORMAL HIGH (ref 4.8–5.6)
Mean Plasma Glucose: 278 mg/dL

## 2023-09-29 LAB — LIPASE, BLOOD: Lipase: 25 U/L (ref 11–51)

## 2023-09-29 LAB — MAGNESIUM: Magnesium: 1.6 mg/dL — ABNORMAL LOW (ref 1.7–2.4)

## 2023-09-29 MED ORDER — ONDANSETRON HCL 4 MG/2ML IJ SOLN
4.0000 mg | Freq: Once | INTRAMUSCULAR | Status: AC
  Administered 2023-09-29: 4 mg via INTRAVENOUS
  Filled 2023-09-29: qty 2

## 2023-09-29 MED ORDER — PROCHLORPERAZINE EDISYLATE 10 MG/2ML IJ SOLN
10.0000 mg | Freq: Once | INTRAMUSCULAR | Status: AC
  Administered 2023-09-29: 10 mg via INTRAVENOUS
  Filled 2023-09-29: qty 2

## 2023-09-29 MED ORDER — LACTATED RINGERS IV BOLUS
1000.0000 mL | Freq: Once | INTRAVENOUS | Status: AC
  Administered 2023-09-29: 1000 mL via INTRAVENOUS

## 2023-09-29 MED ORDER — KETOROLAC TROMETHAMINE 15 MG/ML IJ SOLN
15.0000 mg | Freq: Once | INTRAMUSCULAR | Status: AC
  Administered 2023-09-29: 15 mg via INTRAVENOUS
  Filled 2023-09-29: qty 1

## 2023-09-29 MED ORDER — LIVING WELL WITH DIABETES BOOK
Freq: Once | Status: AC
  Filled 2023-09-29: qty 1

## 2023-09-29 MED ORDER — PANTOPRAZOLE SODIUM 40 MG IV SOLR
40.0000 mg | Freq: Once | INTRAVENOUS | Status: AC
  Administered 2023-09-29: 40 mg via INTRAVENOUS
  Filled 2023-09-29: qty 10

## 2023-09-29 MED ORDER — MAGNESIUM SULFATE IN D5W 1-5 GM/100ML-% IV SOLN
1.0000 g | Freq: Once | INTRAVENOUS | Status: AC
  Administered 2023-09-29: 1 g via INTRAVENOUS
  Filled 2023-09-29: qty 100

## 2023-09-29 MED ORDER — ONDANSETRON 4 MG PO TBDP: 4.0000 mg | ORAL_TABLET | Freq: Three times a day (TID) | ORAL | 0 refills | Status: DC | PRN

## 2023-09-29 MED ORDER — ACETAMINOPHEN 500 MG PO TABS
1000.0000 mg | ORAL_TABLET | Freq: Once | ORAL | Status: AC
  Administered 2023-09-29: 1000 mg via ORAL
  Filled 2023-09-29: qty 2

## 2023-09-29 NOTE — ED Triage Notes (Signed)
 Pt newly dx as diabetic. EMS got BGL of 319. Per pt she has been having BS over 500 and has not been prescribed any meds to control her sugar,.

## 2023-09-29 NOTE — Inpatient Diabetes Management (Signed)
 Inpatient Diabetes Program Recommendations  AACE/ADA: New Consensus Statement on Inpatient Glycemic Control   Target Ranges:  Prepandial:   less than 140 mg/dL      Peak postprandial:   less than 180 mg/dL (1-2 hours)      Critically ill patients:  140 - 180 mg/dL    Latest Reference Range & Units 09/29/23 08:20  Glucose-Capillary 70 - 99 mg/dL 668 (H)    Latest Reference Range & Units 09/29/23 08:20  CO2 22 - 32 mmol/L 22  Glucose 70 - 99 mg/dL 662 (H)  Anion gap 5 - 15  13    Latest Reference Range & Units 01/02/20 10:54 07/01/23 15:22  Hemoglobin A1C 4.8 - 5.6 % 12.2 (H) 11.0 (H)   Review of Glycemic Control  Diabetes history: DM2 Outpatient Diabetes medications: None; prescribed Metformin  500 mg BID Current orders for Inpatient glycemic control: None; in ED  Inpatient Diabetes Program Recommendations:    NOTE: Patient with DM2 hx in ED via EMS with hyperglycemia and headache. Finger stick glucose 331 mg/dl and lab 662 mg/dl today. Per ED triage note, patient reported newly dx with DM. In reviewing chart, noted A1C of 12.2% on 01/02/20 and 11% on 07/01/23. Patient was seen at [facility] on 07/01/23 and per note patient had hyperglycemia (had gestation DM during prior pregnancy) and was called back with result of A1C of 11% and was prescribed Metformin  500 mg BID.  Ordered Living Well with DM book. Per chart, patient has Medicaid insurance and uses [facility] as PCP.   Addendum 09/29/23@12 :20- Went by to talk with patient in the ED but patient already discharged. Called patient at 14:00 on cell phone number and got voicemail. Left message asking for return call to discuss DM.  Thanks, Earnie Gainer, RN, MSN, CDCES Diabetes Coordinator Inpatient Diabetes Program [phone] (Team Pager from 8am to 5pm)

## 2023-09-29 NOTE — Progress Notes (Signed)
   09/29/23 0915  Spiritual Encounters  Type of Visit Initial  Care provided to: Patient  Referral source Chaplain assessment  Reason for visit Routine spiritual support  OnCall Visit No  Spiritual Framework  Presenting Themes Impactful experiences and emotions;Significant life change  Interventions  Spiritual Care Interventions Made Established relationship of care and support;Compassionate presence;Reflective listening;Prayer  Intervention Outcomes  Outcomes Connection to spiritual care;Awareness around self/spiritual resourses;Reduced anxiety  Spiritual Care Plan  Spiritual Care Issues Still Outstanding No further spiritual care needs at this time (see row info)   Chaplain was rounding in ED when she noticed that pt was crying. Chaplain stopped to see if there was anything she could do. Pt is concerned about her 35 yr old who has fluid on the brain which could cause her to lose her eyesight. In the meantime, pt is dealing with a new diagnosis of diabetes and needs to learn how to take care of her health as well. Pt asked for prayer for her daughter. Chaplain asked if she could pray for her and for her daughter, pt said yes and chaplain prayed.

## 2023-09-29 NOTE — ED Provider Notes (Signed)
 [facility] Provider Note    Event Date/Time   First MD Initiated Contact with Patient 09/29/23 [phone]     (approximate)   History   Hyperglycemia  HPI  Marie Palmer is a 33 y.o. female past medical history significant for diabetes on metformin  who presents to the emergency department for not feeling well.  States that she was not feeling well this weekend with significantly elevated glucose levels.  Today got to work and started to feel worse with generalized weakness, chest pain and nausea and vomiting.  When EMS arrived her glucose was elevated in the 300s.  States that she had 2 episodes of passing out on Sunday but she immediately woke right back up.  States that this occurred while she was standing making food in the kitchen.  Complaining of chest pain at this time with some mild chest pressure.  Denies any exertional symptoms.  No significant shortness of breath.  No history of DVT or PE.  States that she does not currently have a primary care provider.  No recent falls or trauma.  Complaining of an ongoing intermittent headache.  Denies any change in vision or slurring of speech.  No dysuria, urinary urgency or frequency.  Prior hysterectomy.  [place]rell most of her medical care at the [facility] urgent care.  Stated that she noted some blood in her stool but unable to state what it looks like.  Does state that she took a picture.  Denies any melena.  No NSAID use.  Denies any alcohol use.  Does endorse tobacco use but denies any marijuana use.  States that she has a history of migraine headaches but has never been formally diagnosed by neurology.  States that she has had to receive cocktails for migraines in the past.  Feels similar to her migraine currently.  Given 1 L of LR with EMS prior to arrival     Physical Exam   Triage Vital Signs: ED Triage Vitals  Encounter Vitals Group     BP 09/29/23 0815 (!) 151/93     Girls Systolic BP Percentile --      Girls  Diastolic BP Percentile --      Boys Systolic BP Percentile --      Boys Diastolic BP Percentile --      Pulse Rate 09/29/23 0815 69     Resp 09/29/23 0815 (!) 21     Temp 09/29/23 0818 98.9 F (37.2 C)     Temp Source 09/29/23 0818 Oral     SpO2 09/29/23 0815 100 %     Weight 09/29/23 0818 220 lb 10.9 oz (100.1 kg)     Height --      Head Circumference --      Peak Flow --      Pain Score 09/29/23 0818 7     Pain Loc --      Pain Education --      Exclude from Growth Chart --     Most recent vital signs: Vitals:   09/29/23 0818 09/29/23 1100  BP:  (!) 142/88  Pulse:  61  Resp:  18  Temp: 98.9 F (37.2 C) 98.4 F (36.9 C)  SpO2:  97%    Physical Exam Constitutional:      Appearance: She is well-developed.  HENT:     Head: Atraumatic.  Eyes:     Conjunctiva/sclera: Conjunctivae normal.  Cardiovascular:     Rate and Rhythm: Regular rhythm.  Pulmonary:  Effort: No respiratory distress.  Abdominal:     General: There is no distension.     Tenderness: There is no abdominal tenderness.  Musculoskeletal:        General: Normal range of motion.     Cervical back: Normal range of motion.     Right lower leg: No edema.     Left lower leg: No edema.  Skin:    General: Skin is warm.     Capillary Refill: Capillary refill takes less than 2 seconds.  Neurological:     Mental Status: She is alert. Mental status is at baseline.  Psychiatric:        Mood and Affect: Mood normal.     IMPRESSION / MDM / ASSESSMENT AND PLAN / ED COURSE  I reviewed the triage vital signs and the nursing notes.  Differential diagnosis including hyperglycemia, DKA, electrolyte abnormality, intracranial hemorrhage, migraine headache, ACS, pneumonia, pericarditis  EKG  I, Clotilda Punter, the attending physician, personally viewed and interpreted this ECG.   Rate: Normal  Rhythm: Normal sinus  Axis: Normal  Intervals: Normal  ST&T Change: None  No tachycardic or bradycardic  dysrhythmias while on cardiac telemetry.  RADIOLOGY I independently reviewed imaging, my interpretation of imaging: Chest x-ray with no widened mediastinum, no pneumonia, read as no acute findings.  CT scan of the head with no signs of intracranial hemorrhage, mass effect or mass.  LABS (all labs ordered are listed, but only abnormal results are displayed) Labs interpreted as -    Labs Reviewed  CBC WITH DIFFERENTIAL/PLATELET - Abnormal; Notable for the following components:      Result Value   RBC 5.17 (*)    Hemoglobin 15.7 (*)    All other components within normal limits  COMPREHENSIVE METABOLIC PANEL WITH GFR - Abnormal; Notable for the following components:   Glucose, Bld 337 (*)    All other components within normal limits  MAGNESIUM  - Abnormal; Notable for the following components:   Magnesium  1.6 (*)    All other components within normal limits  CBG MONITORING, ED - Abnormal; Notable for the following components:   Glucose-Capillary 331 (*)    All other components within normal limits  CBG MONITORING, ED - Abnormal; Notable for the following components:   Glucose-Capillary 254 (*)    All other components within normal limits  LIPASE, BLOOD  URINALYSIS, W/ REFLEX TO CULTURE (INFECTION SUSPECTED)  HEMOGLOBIN A1C  TROPONIN I (HIGH SENSITIVITY)     MDM  Lab work with hyperglycemia but does not meet criteria for DKA.  Troponin is normal, heart score of 1, symptoms have been ongoing for more than a couple of hours, have low suspicion for ACS do not feel that serial troponins are necessary at this time.  No active chest pain at this time.  Given Zofran .  Will give another 1 L of IV fluids.  Given ketorolac  for her headache.  Reevaluation states she is feeling much better, nausea and vomiting have improved continues to have an ongoing mild headache.  Will give a dose of Compazine  and Tylenol .  CT scan of the head is still pending but I do not see any acute findings.  Will  give another 1 L of IV fluids and recheck her glucose.  Will give a referral for primary care and from neurology.  No thunderclap headache and not the worst headache of her life -low suspicion for subarachnoid hemorrhage.  Do not feel that CTA is necessary at this time.  Very low suspicion for cerebral venous thrombosis.  Repeat glucose 254 without intervention other than IV fluids.  Given referral for primary care physician.  Discussed return precautions.  No symptoms of urinary tract infection.     PROCEDURES:  Critical Care performed: No  Procedures  Patient's presentation is most consistent with acute presentation with potential threat to life or bodily function.   MEDICATIONS ORDERED IN ED: Medications  ondansetron  (ZOFRAN ) injection 4 mg (4 mg Intravenous Given 09/29/23 0910)  ketorolac  (TORADOL ) 15 MG/ML injection 15 mg (15 mg Intravenous Given 09/29/23 0910)  pantoprazole  (PROTONIX ) injection 40 mg (40 mg Intravenous Given 09/29/23 0910)  lactated ringers  bolus 1,000 mL (0 mLs Intravenous Stopped 09/29/23 1130)  magnesium  sulfate IVPB 1 g 100 mL (0 g Intravenous Stopped 09/29/23 1130)  acetaminophen  (TYLENOL ) tablet 1,000 mg (1,000 mg Oral Given 09/29/23 0929)  prochlorperazine  (COMPAZINE ) injection 10 mg (10 mg Intravenous Given 09/29/23 0928)  living well with diabetes book MISC ( Does not apply Given 09/29/23 1131)    FINAL CLINICAL IMPRESSION(S) / ED DIAGNOSES   Final diagnoses:  Hyperglycemia  Nonintractable headache, unspecified chronicity pattern, unspecified headache type     Rx / DC Orders   ED Discharge Orders          Ordered    Ambulatory Referral to Primary Care (Establish Care)        09/29/23 0928    ondansetron  (ZOFRAN -ODT) 4 MG disintegrating tablet  Every 8 hours PRN        09/29/23 0929             Note:  This document was prepared using Dragon voice recognition software and may include unintentional dictation errors.   Suzanne Kirsch,  MD 09/29/23 1207

## 2023-09-29 NOTE — Discharge Instructions (Addendum)
 You are seen in the emergency department for an elevated glucose level.  You are given IV fluids in the emergency department which improved your glucose.  Your CT scan of your head was normal.  Your chest x-ray was normal.  You had heart enzymes that were also normal and do not believe you are having a heart attack today.  You are given a referral for primary care physician and a list of primary care providers in the area.  You are given a prescription for nausea medication and a refill for your metformin .  It is importantly follow-up closely with your primary care physician to see if you need further changing of the doses of your metformin .  You were given information of a low-carb diet.  Stay hydrated and drink plenty of water.  Do not drink any sugary drinks.  You are given information to follow-up with neurology as an outpatient given your ongoing and recurrent headaches.

## 2023-11-09 ENCOUNTER — Ambulatory Visit

## 2023-11-17 ENCOUNTER — Encounter: Payer: Self-pay | Admitting: Emergency Medicine

## 2023-11-17 ENCOUNTER — Ambulatory Visit
Admission: EM | Admit: 2023-11-17 | Discharge: 2023-11-17 | Disposition: A | Discharge status: Home / Self Care | Attending: Emergency Medicine | Admitting: Emergency Medicine

## 2023-11-17 DIAGNOSIS — J069 Acute upper respiratory infection, unspecified: Secondary | ICD-10-CM | POA: Insufficient documentation

## 2023-11-17 LAB — RESP PANEL BY RT-PCR (FLU A&B, COVID) ARPGX2
Influenza A by PCR: NEGATIVE
Influenza B by PCR: NEGATIVE
SARS Coronavirus 2 by RT PCR: NEGATIVE

## 2023-11-17 MED ORDER — IPRATROPIUM BROMIDE 0.06 % NA SOLN
2.0000 | Freq: Four times a day (QID) | NASAL | 12 refills | Status: AC
Start: 2023-11-17 — End: ?

## 2023-11-17 NOTE — ED Triage Notes (Signed)
 Pt c/o fever, chills, bodyaches and fatigue since yesterday. Pt is concerned about Covid. Pt took ibuprofen  for her symptoms.
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
## 2023-11-17 NOTE — Discharge Instructions (Addendum)
 Your testing today was negative for COVID and influenza.  I do believe you have a respiratory virus which is causing your symptoms.  Use over-the-counter Tylenol  and/or ibuprofen  according to the package instructions as needed for any fever or pain.  To soothe your throat you may gargle with warm salt water.  Mix 1 tablespoon a tablespoon in 8 ounces of warm water, gargle and spit.  You may do this as often as you would like.  You may also use over-the-counter Chloraseptic or Sucrets lozenges.  No more than 1 lozenge every 2 hours as the menthol  may give you diarrhea.  Use the Atrovent  nasal spray, 2 squirts up each nostril for 6 hours, as needed for nasal congestion.  If you develop any new or worsening symptoms other return for reevaluation or see your primary care provider.

## 2023-12-11 ENCOUNTER — Ambulatory Visit
Admission: RE | Admit: 2023-12-11 | Discharge: 2023-12-11 | Disposition: A | Discharge status: Home / Self Care | Payer: Self-pay | Source: Ambulatory Visit | Attending: Emergency Medicine | Admitting: Emergency Medicine

## 2023-12-11 VITALS — BP 135/91 | HR 85 | Temp 98.6°F | Resp 14 | Ht 66.0 in | Wt 211.0 lb

## 2023-12-11 DIAGNOSIS — F17200 Nicotine dependence, unspecified, uncomplicated: Secondary | ICD-10-CM | POA: Diagnosis not present

## 2023-12-11 DIAGNOSIS — R519 Headache, unspecified: Secondary | ICD-10-CM

## 2023-12-11 MED ORDER — BUTALBITAL-APAP-CAFFEINE 50-325-40 MG PO TABS
1.0000 | ORAL_TABLET | Freq: Four times a day (QID) | ORAL | 0 refills | Status: AC | PRN
Start: 2023-12-11 — End: 2023-12-14

## 2023-12-11 NOTE — ED Triage Notes (Signed)
 Patient reports headache that started yesterday.  Patient states that she has been taking Tylenol  with no relief.  Patient denies any cold symptoms.

## 2023-12-11 NOTE — Discharge Instructions (Addendum)
 Take medication as directed. Keep a headache log to keep track of your symptoms and degree of headache pain, see if there is any pattern to foods, menstrual cycle(ovulation,etc). Stop smoking, avoid alcohol,caffeine , etc as they may all make symptoms worse.   If you develop worst headache of your life you will need to go to emergency room for further evaluation, please keep your neurology appointment in November as scheduled.
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
## 2024-03-23 ENCOUNTER — Ambulatory Visit
Admission: RE | Admit: 2024-03-23 | Discharge: 2024-03-23 | Disposition: A | Discharge status: Home / Self Care | Payer: Self-pay | Source: Ambulatory Visit | Attending: Internal Medicine | Admitting: Internal Medicine

## 2024-03-23 VITALS — BP 134/92 | HR 81 | Temp 98.3°F | Resp 20

## 2024-03-23 DIAGNOSIS — F17210 Nicotine dependence, cigarettes, uncomplicated: Secondary | ICD-10-CM

## 2024-03-23 DIAGNOSIS — J111 Influenza due to unidentified influenza virus with other respiratory manifestations: Secondary | ICD-10-CM | POA: Diagnosis not present

## 2024-03-23 DIAGNOSIS — J069 Acute upper respiratory infection, unspecified: Secondary | ICD-10-CM

## 2024-03-23 LAB — POCT INFLUENZA A/B
Influenza A, POC: NEGATIVE
Influenza B, POC: NEGATIVE

## 2024-03-23 LAB — POC SOFIA SARS ANTIGEN FIA: SARS Coronavirus 2 Ag: NEGATIVE

## 2024-03-23 MED ORDER — OSELTAMIVIR PHOSPHATE 75 MG PO CAPS: 75.0000 mg | ORAL_CAPSULE | Freq: Two times a day (BID) | ORAL | 0 refills | Status: AC

## 2024-03-23 MED ORDER — ALBUTEROL SULFATE HFA 108 (90 BASE) MCG/ACT IN AERS: 1.0000 | INHALATION_SPRAY | Freq: Four times a day (QID) | RESPIRATORY_TRACT | 0 refills | Status: AC | PRN

## 2024-03-23 MED ORDER — ONDANSETRON 4 MG PO TBDP: 4.0000 mg | ORAL_TABLET | Freq: Three times a day (TID) | ORAL | 0 refills | Status: AC | PRN

## 2024-03-23 NOTE — Discharge Instructions (Signed)
 You have the flu. Take Tamiflu  every 12 hours for the next 5 days to improve symptoms and stop the virus from replicating in your body. Ibuprofen /tylenol  as needed for fevers and body aches. Mucinex as needed for nasal congestion. Zofran  as needed for nausea/vomiting. Albuterol  2 puffs every 4-6 hours as needed for shortness of breath and wheezing.   If you develop any new or worsening symptoms or if your symptoms do not start to improve, please return here or follow-up with your primary care provider. If your symptoms are severe, please go to the emergency room.

## 2024-03-23 NOTE — ED Triage Notes (Signed)
 Patient complains of fever, sore throat, cough , bodyaches, headache, and chills and diarrhea x 2 days. Patient has reports 6 episodes of diarrhea today. Patient has taken mucinex, There-flu, and Tylenol  with no relief. Rates sore throat pain 6/10, bodyaches 9/10 and headache 9/10.
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
## 2024-03-30 ENCOUNTER — Ambulatory Visit: Payer: Self-pay

## 2024-03-30 ENCOUNTER — Ambulatory Visit

## 2024-03-30 ENCOUNTER — Ambulatory Visit
Admission: EM | Admit: 2024-03-30 | Discharge: 2024-03-30 | Disposition: A | Discharge status: Home / Self Care | Attending: Family Medicine | Admitting: Family Medicine

## 2024-03-30 ENCOUNTER — Ambulatory Visit: Payer: Self-pay | Admitting: Family Medicine

## 2024-03-30 DIAGNOSIS — J40 Bronchitis, not specified as acute or chronic: Secondary | ICD-10-CM

## 2024-03-30 DIAGNOSIS — R0602 Shortness of breath: Secondary | ICD-10-CM

## 2024-03-30 DIAGNOSIS — F17210 Nicotine dependence, cigarettes, uncomplicated: Secondary | ICD-10-CM | POA: Diagnosis not present

## 2024-03-30 MED ORDER — DOXYCYCLINE HYCLATE 100 MG PO CAPS: 100.0000 mg | ORAL_CAPSULE | Freq: Two times a day (BID) | ORAL | 0 refills | Status: AC

## 2024-03-30 MED ORDER — IPRATROPIUM-ALBUTEROL 0.5-2.5 (3) MG/3ML IN SOLN
3.0000 mL | Freq: Once | RESPIRATORY_TRACT | Status: AC
  Administered 2024-03-30: 3 mL via RESPIRATORY_TRACT

## 2024-03-30 MED ORDER — PREDNISONE 50 MG PO TABS: 50.0000 mg | ORAL_TABLET | Freq: Every day | ORAL | 0 refills | Status: AC

## 2024-03-30 NOTE — ED Triage Notes (Addendum)
 Sx x 2 weeks  Patient states that she's still having sx.   Cough Fatigue Bodyaches Diarrhea Loss of voice.  Fever- took ibu this morning at 5 am   Patient was dx with flu on 03/23/24

## 2024-03-30 NOTE — Discharge Instructions (Addendum)
 Your chest xray did not show evidence of pneumonia  though the radiologist has not yet read it. If they find something that I didn't, I will call you.    Stop by the pharmacy to pick up your prescriptions.  Follow up with your primary care provider or return to the urgent care, if not improving.
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
